# Patient Record
Sex: Male | Born: 1940
Health system: Southern US, Community
[De-identification: ages and names within clinical notes are randomized; demographics above are authoritative.]

## PROBLEM LIST (undated history)

## (undated) DIAGNOSIS — N179 Acute kidney failure, unspecified: Secondary | ICD-10-CM

## (undated) DIAGNOSIS — R079 Chest pain, unspecified: Secondary | ICD-10-CM

## (undated) DIAGNOSIS — Z85828 Personal history of other malignant neoplasm of skin: Secondary | ICD-10-CM

## (undated) DIAGNOSIS — M1990 Unspecified osteoarthritis, unspecified site: Secondary | ICD-10-CM

## (undated) DIAGNOSIS — Z8739 Personal history of other diseases of the musculoskeletal system and connective tissue: Secondary | ICD-10-CM

## (undated) DIAGNOSIS — I4892 Unspecified atrial flutter: Secondary | ICD-10-CM

## (undated) DIAGNOSIS — C61 Malignant neoplasm of prostate: Secondary | ICD-10-CM

## (undated) DIAGNOSIS — K219 Gastro-esophageal reflux disease without esophagitis: Secondary | ICD-10-CM

## (undated) DIAGNOSIS — R7303 Prediabetes: Secondary | ICD-10-CM

## (undated) DIAGNOSIS — Z8719 Personal history of other diseases of the digestive system: Secondary | ICD-10-CM

## (undated) HISTORY — DX: Acute kidney failure, unspecified: N17.9

## (undated) HISTORY — DX: Malignant neoplasm of prostate: C61

## (undated) HISTORY — DX: Unspecified atrial flutter: I48.92

## (undated) HISTORY — PX: WRIST SURGERY: SHX841

## (undated) HISTORY — PX: OTHER SURGICAL HISTORY: SHX169

## (undated) HISTORY — DX: Chest pain, unspecified: R07.9

## (undated) HISTORY — PX: CIRCUMCISION: SUR203

---

## 2008-11-11 ENCOUNTER — Encounter: Admission: RE | Admit: 2008-11-11 | Discharge: 2008-11-11 | Payer: Self-pay | Admitting: Internal Medicine

## 2009-11-21 ENCOUNTER — Ambulatory Visit (HOSPITAL_BASED_OUTPATIENT_CLINIC_OR_DEPARTMENT_OTHER): Admission: RE | Admit: 2009-11-21 | Discharge: 2009-11-21 | Payer: Self-pay | Admitting: Urology

## 2011-06-16 DIAGNOSIS — Z23 Encounter for immunization: Secondary | ICD-10-CM | POA: Diagnosis not present

## 2011-06-25 DIAGNOSIS — H6120 Impacted cerumen, unspecified ear: Secondary | ICD-10-CM | POA: Diagnosis not present

## 2011-06-25 DIAGNOSIS — H902 Conductive hearing loss, unspecified: Secondary | ICD-10-CM | POA: Diagnosis not present

## 2011-12-09 DIAGNOSIS — D235 Other benign neoplasm of skin of trunk: Secondary | ICD-10-CM | POA: Diagnosis not present

## 2011-12-09 DIAGNOSIS — L570 Actinic keratosis: Secondary | ICD-10-CM | POA: Diagnosis not present

## 2011-12-09 DIAGNOSIS — L820 Inflamed seborrheic keratosis: Secondary | ICD-10-CM | POA: Diagnosis not present

## 2012-02-05 DIAGNOSIS — J189 Pneumonia, unspecified organism: Secondary | ICD-10-CM | POA: Diagnosis not present

## 2012-04-07 DIAGNOSIS — M1710 Unilateral primary osteoarthritis, unspecified knee: Secondary | ICD-10-CM | POA: Diagnosis not present

## 2012-10-25 DIAGNOSIS — R21 Rash and other nonspecific skin eruption: Secondary | ICD-10-CM | POA: Diagnosis not present

## 2012-10-25 DIAGNOSIS — E119 Type 2 diabetes mellitus without complications: Secondary | ICD-10-CM | POA: Diagnosis not present

## 2012-11-12 DIAGNOSIS — J069 Acute upper respiratory infection, unspecified: Secondary | ICD-10-CM | POA: Diagnosis not present

## 2013-01-05 DIAGNOSIS — H902 Conductive hearing loss, unspecified: Secondary | ICD-10-CM | POA: Diagnosis not present

## 2013-01-05 DIAGNOSIS — H6120 Impacted cerumen, unspecified ear: Secondary | ICD-10-CM | POA: Diagnosis not present

## 2013-01-18 DIAGNOSIS — L570 Actinic keratosis: Secondary | ICD-10-CM | POA: Diagnosis not present

## 2013-01-18 DIAGNOSIS — D235 Other benign neoplasm of skin of trunk: Secondary | ICD-10-CM | POA: Diagnosis not present

## 2013-03-08 DIAGNOSIS — L570 Actinic keratosis: Secondary | ICD-10-CM | POA: Diagnosis not present

## 2013-04-13 DIAGNOSIS — Z1211 Encounter for screening for malignant neoplasm of colon: Secondary | ICD-10-CM | POA: Diagnosis not present

## 2013-04-13 DIAGNOSIS — Z23 Encounter for immunization: Secondary | ICD-10-CM | POA: Diagnosis not present

## 2013-04-13 DIAGNOSIS — E782 Mixed hyperlipidemia: Secondary | ICD-10-CM | POA: Diagnosis not present

## 2013-04-13 DIAGNOSIS — M069 Rheumatoid arthritis, unspecified: Secondary | ICD-10-CM | POA: Diagnosis not present

## 2013-04-13 DIAGNOSIS — Z125 Encounter for screening for malignant neoplasm of prostate: Secondary | ICD-10-CM | POA: Diagnosis not present

## 2013-04-13 DIAGNOSIS — E119 Type 2 diabetes mellitus without complications: Secondary | ICD-10-CM | POA: Diagnosis not present

## 2013-04-13 DIAGNOSIS — Z0000 Encounter for general adult medical examination without abnormal findings: Secondary | ICD-10-CM | POA: Diagnosis not present

## 2013-04-24 DIAGNOSIS — Z1211 Encounter for screening for malignant neoplasm of colon: Secondary | ICD-10-CM | POA: Diagnosis not present

## 2013-05-03 DIAGNOSIS — R972 Elevated prostate specific antigen [PSA]: Secondary | ICD-10-CM | POA: Diagnosis not present

## 2013-05-03 DIAGNOSIS — N400 Enlarged prostate without lower urinary tract symptoms: Secondary | ICD-10-CM | POA: Diagnosis not present

## 2013-05-11 DIAGNOSIS — R972 Elevated prostate specific antigen [PSA]: Secondary | ICD-10-CM | POA: Diagnosis not present

## 2013-05-11 DIAGNOSIS — C61 Malignant neoplasm of prostate: Secondary | ICD-10-CM | POA: Diagnosis not present

## 2013-05-18 DIAGNOSIS — C61 Malignant neoplasm of prostate: Secondary | ICD-10-CM | POA: Diagnosis not present

## 2013-05-31 ENCOUNTER — Other Ambulatory Visit: Payer: Self-pay | Admitting: Urology

## 2013-06-14 DIAGNOSIS — M6281 Muscle weakness (generalized): Secondary | ICD-10-CM | POA: Diagnosis not present

## 2013-06-14 DIAGNOSIS — R279 Unspecified lack of coordination: Secondary | ICD-10-CM | POA: Diagnosis not present

## 2013-06-14 DIAGNOSIS — C61 Malignant neoplasm of prostate: Secondary | ICD-10-CM | POA: Diagnosis not present

## 2013-06-22 DIAGNOSIS — C61 Malignant neoplasm of prostate: Secondary | ICD-10-CM | POA: Diagnosis not present

## 2013-06-22 DIAGNOSIS — R279 Unspecified lack of coordination: Secondary | ICD-10-CM | POA: Diagnosis not present

## 2013-06-22 DIAGNOSIS — M6281 Muscle weakness (generalized): Secondary | ICD-10-CM | POA: Diagnosis not present

## 2013-06-27 ENCOUNTER — Encounter (HOSPITAL_COMMUNITY): Payer: Self-pay | Admitting: Pharmacy Technician

## 2013-06-27 NOTE — Patient Instructions (Addendum)
Robert Summers  06/27/2013                           YOUR PROCEDURE IS SCHEDULED ON: 07/05/13               PLEASE REPORT TO SHORT STAY CENTER AT : 10:45 am               CALL THIS NUMBER IF ANY PROBLEMS THE DAY OF SURGERY :               [phone]                      REMEMBER:   Do not eat food or drink liquids AFTER MIDNIGHT  May have clear liquids UNTIL 6 HOURS BEFORE SURGERY (7:15 AM)  Clear liquids include soda, tea, black coffee, apple or grape juice, broth.  Take these medicines the morning of surgery with A SIP OF WATER:  NONE   Do not wear jewelry, make-up   Do not wear lotions, powders, or perfumes.   Do not shave legs or underarms 12 hrs. before surgery (men may shave face)  Do not bring valuables to the hospital.  Contacts, dentures or bridgework may not be worn into surgery.  Leave suitcase in the car. After surgery it may be brought to your room.  For patients admitted to the hospital more than one night, checkout time is 11:00                          The day of discharge.   Patients discharged the day of surgery will not be allowed to drive home                             If going home same day of surgery, must have someone stay with you first                           24 hrs at home and arrange for some one to drive you home from hospital.    Special Instructions:   Please read over the following fact sheets that you were given:                                    2. [place]                                                X_____________________________________________________________________        Failure to follow these instructions may result in cancellation of your surgery

## 2013-06-28 ENCOUNTER — Encounter (HOSPITAL_COMMUNITY): Payer: Self-pay

## 2013-06-28 ENCOUNTER — Encounter (HOSPITAL_COMMUNITY)
Admission: RE | Admit: 2013-06-28 | Discharge: 2013-06-28 | Disposition: A | Discharge status: Home / Self Care | Payer: Medicare Other | Source: Ambulatory Visit | Attending: Urology | Admitting: Urology

## 2013-06-28 DIAGNOSIS — Z01812 Encounter for preprocedural laboratory examination: Secondary | ICD-10-CM | POA: Diagnosis not present

## 2013-06-28 DIAGNOSIS — Z01810 Encounter for preprocedural cardiovascular examination: Secondary | ICD-10-CM | POA: Insufficient documentation

## 2013-06-28 DIAGNOSIS — Z01818 Encounter for other preprocedural examination: Secondary | ICD-10-CM | POA: Insufficient documentation

## 2013-06-28 HISTORY — DX: Prediabetes: R73.03

## 2013-06-28 HISTORY — DX: Personal history of other diseases of the musculoskeletal system and connective tissue: Z87.39

## 2013-06-28 HISTORY — DX: Unspecified osteoarthritis, unspecified site: M19.90

## 2013-06-28 HISTORY — DX: Personal history of other diseases of the digestive system: Z87.19

## 2013-06-28 HISTORY — DX: Personal history of other malignant neoplasm of skin: Z85.828

## 2013-06-28 HISTORY — DX: Malignant neoplasm of prostate: C61

## 2013-06-28 HISTORY — DX: Gastro-esophageal reflux disease without esophagitis: K21.9

## 2013-06-28 LAB — CBC
HCT: 49.8 % (ref 39.0–52.0)
Hemoglobin: 17.6 g/dL — ABNORMAL HIGH (ref 13.0–17.0)
MCH: 30.9 pg (ref 26.0–34.0)
MCHC: 35.3 g/dL (ref 30.0–36.0)
MCV: 87.4 fL (ref 78.0–100.0)
Platelets: 166 10*3/uL (ref 150–400)
RBC: 5.7 MIL/uL (ref 4.22–5.81)
RDW: 13.2 % (ref 11.5–15.5)
WBC: 5.5 10*3/uL (ref 4.0–10.5)

## 2013-06-28 LAB — BASIC METABOLIC PANEL
BUN: 17 mg/dL (ref 6–23)
CALCIUM: 9.6 mg/dL (ref 8.4–10.5)
CHLORIDE: 101 meq/L (ref 96–112)
CO2: 27 mEq/L (ref 19–32)
CREATININE: 1.26 mg/dL (ref 0.50–1.35)
GFR calc Af Amer: 64 mL/min — ABNORMAL LOW (ref >90)
GFR calc non Af Amer: 55 mL/min — ABNORMAL LOW (ref >90)
Glucose, Bld: 116 mg/dL — ABNORMAL HIGH (ref 70–99)
POTASSIUM: 4.9 meq/L (ref 3.7–5.3)
SODIUM: 139 meq/L (ref 137–147)

## 2013-06-28 LAB — ABO/RH: ABO/RH(D): O POS

## 2013-06-28 NOTE — Progress Notes (Signed)
EKG reviewed by Dr. Lissa Hoard - no action needed

## 2013-06-29 DIAGNOSIS — R279 Unspecified lack of coordination: Secondary | ICD-10-CM | POA: Diagnosis not present

## 2013-06-29 DIAGNOSIS — M6281 Muscle weakness (generalized): Secondary | ICD-10-CM | POA: Diagnosis not present

## 2013-06-29 DIAGNOSIS — C61 Malignant neoplasm of prostate: Secondary | ICD-10-CM | POA: Diagnosis not present

## 2013-07-01 DIAGNOSIS — J069 Acute upper respiratory infection, unspecified: Secondary | ICD-10-CM | POA: Diagnosis not present

## 2013-07-01 DIAGNOSIS — R509 Fever, unspecified: Secondary | ICD-10-CM | POA: Diagnosis not present

## 2013-07-04 NOTE — H&P (Signed)
ctive Problems Problems   1. Benign prostatic hypertrophy without lower urinary tract symptoms (600.00)  2. Elevated prostate specific antigen (PSA) (790.93)  3. Prostate cancer (185)  History of Present Illness   Robert Summers returns today in f/u to discuss his prostate biopsy.  He was found to have a T2a Nx Mx Gleason 6(3+3) cancer in 4 cores with the bulk of the cancer in the right base where the induration was noted.  His prostate volume was 38cc.  His [place] score is 3 which predicts a 41% probability of positive margins, a 26% probability of ECE, 9% SVI and 1.4% LNI with a predicted BRFS at 3 years of 73% and at 5 years of 60%.   Past Medical History Problems   1. History of Arthritis (V13.4)  2. History of Gout (274.00)  3. History of esophageal reflux (V12.79)  4. History of Hydrocele of testis (603.9)  5. History of Thrombophlebitis of leg, superficial (451.0)  Surgical History Problems   1. History of Elective Circumcision  2. History of Surgery Excision Of Spermatocele With Epididymectomy Right  3. History of Surgery Tunica Vaginalis Excision Of Hydrocele Right  4. History of Wrist Incision  Current Meds  1. Aspirin 81 MG Oral Tablet;  Therapy: (Recorded:16Jun2011) to Recorded  2. Biotin TABS;  Therapy: (Recorded:03Dec2014) to Recorded  3. Levofloxacin 500 MG Oral Tablet; Take one tablet daily starting day before procedure;  Therapy: [id number] to (Evaluate:06Dec2014)  Requested for: [id number]; Last  Rx:03Dec2014 Ordered  4. Magnesium CAPS;  Therapy: (Recorded:03Dec2014) to Recorded  5. Saw Palmetto Plus CAPS;  Therapy: (Recorded:03Dec2014) to Recorded  6. Super B-100 TABS;  Therapy: (Recorded:03Dec2014) to Recorded  7. Vitamin C TABS;  Therapy: (Recorded:03Dec2014) to Recorded  8. Vitamin D-3 TABS;  Therapy: (Recorded:03Dec2014) to Recorded  Allergies Medication   1. No Known Drug Allergies  Family History Problems   1. Family history of Death In The Family  Father : Father   deceased age 70--[place]  21. Family history of Death In The Family Mother : Father   deceased age 62--unknown  78. Family history of Diabetes Mellitus (V18.0) : Mother  4. Family history of Family Health Status Number Of Children : Father   2 daughters  37. Family history of malignant neoplasm of prostate (V25.36) : Brother  6. Family history of Heart Disease (V17.49) : Father  Social History Problems   1. Denied: History of Alcohol Use  2. Caffeine Use   1 per day  3. Former smoker (V15.82)  4. Marital History - Currently Married  5. Married  6. Retired   from [employer] and then [employer].  7. Retired From Work  18. Social alcohol use  9. Tobacco Use (V15.82)   smoked for 15yrs quit 48yrs ago  Results/Data  The following clinical lab reports were reviewed:  Path report reviewed.  The following medical tests were reviewed: CAPRA score calculated and reviewed.  IPSS: The IPSS today is 0   SHIM: The SHIM score today is 25 .    Assessment Assessed   1. Prostate cancer (185)   He has T2a Nx Mx Gleason 6 prostate cancer with a PSA that has been rising.   He has 2 adjacent cores in the lateral prostate at the right mid and base gland with 40% and 80% involvement.   His prostate is small and he has normal erectile function and no voiding complaints.   Plan Prostate cancer   1. Follow-up Schedule Surgery Office  Follow-up  Status: Hold For - Appointment   Requested for: [phone]   I have reviewed the treatment options and risks as outlined in my prostate cancer handout that he was given for review.  I will not recapitulate the details.  He is a potential candidate for AS with his low risk disease and age but I am concerned about the PSA rise, his lack of comorbidities and the more bulky disease at the right base.  If he were to choose that option, I would want to consider possibly a Prolaris test or an early repeat biopsy, but at this time he is not interested in  AS.  He is a candidate for radical prostatectomy and I discussed the 3 approaches and the risks associated with each.  I am concerned that at his age he is at greater risk for postoperative complications as well as a greater risk of persistent incontinence and poor recovery of erectile dysfunction despite his preoperative status.  He and his wife are most interested in this approach and while they have considered perineal prostatectomy as a favorable option were in favor of a robotic approach after our discussion and would like to proceed with a RALP.  I discussed both EXRT and seeds in detail and he would be an excellent candidate for either and in particular seeds with his small gland, low risk disease, minimal LUTS and good erectile function, but they have concerns about radiation and would like to have the prostate out.  I also mentioned cryotherapy and HIFU but didn't recommend either for him.    I will get him posted for RALP and will set up a PT consultation.

## 2013-07-05 ENCOUNTER — Encounter (HOSPITAL_COMMUNITY)
Admission: RE | Disposition: A | Discharge status: Home / Self Care | Payer: Self-pay | Source: Ambulatory Visit | Attending: Urology

## 2013-07-05 ENCOUNTER — Encounter (HOSPITAL_COMMUNITY): Payer: Self-pay | Admitting: *Deleted

## 2013-07-05 ENCOUNTER — Inpatient Hospital Stay (HOSPITAL_COMMUNITY)
Admission: RE | Admit: 2013-07-05 | Discharge: 2013-07-06 | DRG: 708 | Disposition: A | Discharge status: Home / Self Care | Payer: Medicare Other | Source: Ambulatory Visit | Attending: Urology | Admitting: Urology

## 2013-07-05 ENCOUNTER — Inpatient Hospital Stay (HOSPITAL_COMMUNITY): Payer: Medicare Other | Admitting: Certified Registered Nurse Anesthetist

## 2013-07-05 ENCOUNTER — Encounter (HOSPITAL_COMMUNITY): Payer: Medicare Other | Admitting: Certified Registered Nurse Anesthetist

## 2013-07-05 DIAGNOSIS — C61 Malignant neoplasm of prostate: Principal | ICD-10-CM | POA: Diagnosis present

## 2013-07-05 DIAGNOSIS — Z8042 Family history of malignant neoplasm of prostate: Secondary | ICD-10-CM | POA: Diagnosis not present

## 2013-07-05 DIAGNOSIS — Z87891 Personal history of nicotine dependence: Secondary | ICD-10-CM

## 2013-07-05 DIAGNOSIS — K219 Gastro-esophageal reflux disease without esophagitis: Secondary | ICD-10-CM | POA: Diagnosis not present

## 2013-07-05 DIAGNOSIS — Z7982 Long term (current) use of aspirin: Secondary | ICD-10-CM

## 2013-07-05 DIAGNOSIS — N400 Enlarged prostate without lower urinary tract symptoms: Secondary | ICD-10-CM | POA: Diagnosis present

## 2013-07-05 DIAGNOSIS — M109 Gout, unspecified: Secondary | ICD-10-CM | POA: Diagnosis not present

## 2013-07-05 DIAGNOSIS — Z833 Family history of diabetes mellitus: Secondary | ICD-10-CM | POA: Diagnosis not present

## 2013-07-05 DIAGNOSIS — Z8672 Personal history of thrombophlebitis: Secondary | ICD-10-CM

## 2013-07-05 DIAGNOSIS — Z79899 Other long term (current) drug therapy: Secondary | ICD-10-CM

## 2013-07-05 DIAGNOSIS — Z8249 Family history of ischemic heart disease and other diseases of the circulatory system: Secondary | ICD-10-CM | POA: Diagnosis not present

## 2013-07-05 HISTORY — DX: Malignant neoplasm of prostate: C61

## 2013-07-05 HISTORY — PX: ROBOT ASSISTED LAPAROSCOPIC RADICAL PROSTATECTOMY: SHX5141

## 2013-07-05 LAB — TYPE AND SCREEN
ABO/RH(D): O POS
Antibody Screen: NEGATIVE

## 2013-07-05 LAB — HEMOGLOBIN AND HEMATOCRIT, BLOOD
HEMATOCRIT: 43.7 % (ref 39.0–52.0)
Hemoglobin: 15.2 g/dL (ref 13.0–17.0)

## 2013-07-05 SURGERY — ROBOTIC ASSISTED LAPAROSCOPIC RADICAL PROSTATECTOMY
Anesthesia: General

## 2013-07-05 MED ORDER — HEPARIN SODIUM (PORCINE) 1000 UNIT/ML IJ SOLN
INTRAMUSCULAR | Status: AC
  Filled 2013-07-05: qty 1

## 2013-07-05 MED ORDER — DEXAMETHASONE SODIUM PHOSPHATE 10 MG/ML IJ SOLN
INTRAMUSCULAR | Status: DC | PRN
  Administered 2013-07-05: 5 mg via INTRAVENOUS

## 2013-07-05 MED ORDER — ROCURONIUM BROMIDE 100 MG/10ML IV SOLN
INTRAVENOUS | Status: AC
  Filled 2013-07-05: qty 1

## 2013-07-05 MED ORDER — ROCURONIUM BROMIDE 100 MG/10ML IV SOLN
INTRAVENOUS | Status: DC | PRN
  Administered 2013-07-05: 50 mg via INTRAVENOUS
  Administered 2013-07-05 (×4): 10 mg via INTRAVENOUS

## 2013-07-05 MED ORDER — SODIUM CHLORIDE 0.9 % IV BOLUS (SEPSIS)
1000.0000 mL | Freq: Once | INTRAVENOUS | Status: AC
  Administered 2013-07-05: 1000 mL via INTRAVENOUS

## 2013-07-05 MED ORDER — FENTANYL CITRATE 0.05 MG/ML IJ SOLN
INTRAMUSCULAR | Status: DC | PRN
  Administered 2013-07-05: 100 ug via INTRAVENOUS
  Administered 2013-07-05 (×3): 50 ug via INTRAVENOUS

## 2013-07-05 MED ORDER — GLYCOPYRROLATE 0.2 MG/ML IJ SOLN
INTRAMUSCULAR | Status: AC
  Filled 2013-07-05: qty 2

## 2013-07-05 MED ORDER — CIPROFLOXACIN HCL 500 MG PO TABS: 500.0000 mg | ORAL_TABLET | Freq: Two times a day (BID) | ORAL | Status: DC

## 2013-07-05 MED ORDER — MEPERIDINE HCL 50 MG/ML IJ SOLN: 6.2500 mg | INTRAMUSCULAR | Status: DC | PRN

## 2013-07-05 MED ORDER — ONDANSETRON HCL 4 MG/2ML IJ SOLN
INTRAMUSCULAR | Status: AC
  Filled 2013-07-05: qty 2

## 2013-07-05 MED ORDER — HYDROCODONE-ACETAMINOPHEN 5-325 MG PO TABS: 1.0000 | ORAL_TABLET | ORAL | Status: DC | PRN

## 2013-07-05 MED ORDER — LIDOCAINE HCL (CARDIAC) 20 MG/ML IV SOLN
INTRAVENOUS | Status: DC | PRN
  Administered 2013-07-05: 80 mg via INTRAVENOUS

## 2013-07-05 MED ORDER — CEFAZOLIN SODIUM-DEXTROSE 2-3 GM-% IV SOLR
2.0000 g | INTRAVENOUS | Status: AC
  Administered 2013-07-05: 2 g via INTRAVENOUS

## 2013-07-05 MED ORDER — EPHEDRINE SULFATE 50 MG/ML IJ SOLN
INTRAMUSCULAR | Status: AC
Start: 2013-07-05 — End: 2013-07-05
  Filled 2013-07-05: qty 1

## 2013-07-05 MED ORDER — NEOSTIGMINE METHYLSULFATE 1 MG/ML IJ SOLN
INTRAMUSCULAR | Status: AC
  Filled 2013-07-05: qty 10

## 2013-07-05 MED ORDER — LACTATED RINGERS IV SOLN
INTRAVENOUS | Status: DC | PRN
  Administered 2013-07-05: 14:00:00

## 2013-07-05 MED ORDER — SUCCINYLCHOLINE CHLORIDE 20 MG/ML IJ SOLN
INTRAMUSCULAR | Status: DC | PRN
  Administered 2013-07-05: 140 mg via INTRAVENOUS

## 2013-07-05 MED ORDER — GLYCOPYRROLATE 0.2 MG/ML IJ SOLN
INTRAMUSCULAR | Status: AC
  Filled 2013-07-05: qty 3

## 2013-07-05 MED ORDER — OXYCODONE HCL 5 MG/5ML PO SOLN: 5.0000 mg | Freq: Once | ORAL | Status: DC | PRN

## 2013-07-05 MED ORDER — GLYCOPYRROLATE 0.2 MG/ML IJ SOLN
INTRAMUSCULAR | Status: DC | PRN
  Administered 2013-07-05: .8 mg via INTRAVENOUS

## 2013-07-05 MED ORDER — SODIUM CHLORIDE 0.9 % IJ SOLN
INTRAMUSCULAR | Status: AC
  Filled 2013-07-05: qty 10

## 2013-07-05 MED ORDER — FENTANYL CITRATE 0.05 MG/ML IJ SOLN
INTRAMUSCULAR | Status: AC
  Filled 2013-07-05: qty 5

## 2013-07-05 MED ORDER — PROMETHAZINE HCL 25 MG/ML IJ SOLN: 6.2500 mg | INTRAMUSCULAR | Status: DC | PRN

## 2013-07-05 MED ORDER — BUPIVACAINE-EPINEPHRINE PF 0.25-1:200000 % IJ SOLN
INTRAMUSCULAR | Status: AC
  Filled 2013-07-05: qty 30

## 2013-07-05 MED ORDER — STERILE WATER FOR IRRIGATION IR SOLN
Status: DC | PRN
  Administered 2013-07-05: 1500 mL

## 2013-07-05 MED ORDER — OXYCODONE HCL 5 MG PO TABS: 5.0000 mg | ORAL_TABLET | Freq: Once | ORAL | Status: DC | PRN

## 2013-07-05 MED ORDER — BUPIVACAINE-EPINEPHRINE 0.25% -1:200000 IJ SOLN
INTRAMUSCULAR | Status: DC | PRN
  Administered 2013-07-05 (×2): 10 mL

## 2013-07-05 MED ORDER — LACTATED RINGERS IV SOLN
INTRAVENOUS | Status: DC | PRN
  Administered 2013-07-05 (×2): via INTRAVENOUS

## 2013-07-05 MED ORDER — ACETAMINOPHEN 325 MG PO TABS: 650.0000 mg | ORAL_TABLET | ORAL | Status: DC | PRN

## 2013-07-05 MED ORDER — PHENYLEPHRINE HCL 10 MG/ML IJ SOLN
10.0000 mg | INTRAVENOUS | Status: DC | PRN
  Administered 2013-07-05: 40 ug/min via INTRAVENOUS

## 2013-07-05 MED ORDER — CEFAZOLIN SODIUM-DEXTROSE 2-3 GM-% IV SOLR
INTRAVENOUS | Status: AC
  Filled 2013-07-05: qty 50

## 2013-07-05 MED ORDER — NEOSTIGMINE METHYLSULFATE 1 MG/ML IJ SOLN
INTRAMUSCULAR | Status: DC | PRN
  Administered 2013-07-05: 5 mg via INTRAVENOUS

## 2013-07-05 MED ORDER — PHENYLEPHRINE HCL 10 MG/ML IJ SOLN
INTRAMUSCULAR | Status: AC
  Filled 2013-07-05: qty 1

## 2013-07-05 MED ORDER — HYDROMORPHONE HCL PF 1 MG/ML IJ SOLN
INTRAMUSCULAR | Status: DC | PRN
  Administered 2013-07-05: 0.5 mg via INTRAVENOUS
  Administered 2013-07-05: 1 mg via INTRAVENOUS

## 2013-07-05 MED ORDER — HYDROMORPHONE HCL PF 1 MG/ML IJ SOLN: 0.2500 mg | INTRAMUSCULAR | Status: DC | PRN

## 2013-07-05 MED ORDER — LIDOCAINE HCL (CARDIAC) 20 MG/ML IV SOLN
INTRAVENOUS | Status: AC
  Filled 2013-07-05: qty 5

## 2013-07-05 MED ORDER — SODIUM CHLORIDE 0.9 % IR SOLN
Status: DC | PRN
  Administered 2013-07-05: 300 mL via INTRAVESICAL

## 2013-07-05 MED ORDER — HYDROMORPHONE HCL PF 1 MG/ML IJ SOLN: 0.5000 mg | INTRAMUSCULAR | Status: DC | PRN

## 2013-07-05 MED ORDER — PROPOFOL 10 MG/ML IV BOLUS
INTRAVENOUS | Status: AC
  Filled 2013-07-05: qty 20

## 2013-07-05 MED ORDER — HYDROCODONE-ACETAMINOPHEN 5-325 MG PO TABS: 1.0000 | ORAL_TABLET | Freq: Four times a day (QID) | ORAL | Status: DC | PRN

## 2013-07-05 MED ORDER — MIDAZOLAM HCL 5 MG/5ML IJ SOLN
INTRAMUSCULAR | Status: DC | PRN
  Administered 2013-07-05 (×2): 1 mg via INTRAVENOUS

## 2013-07-05 MED ORDER — MIDAZOLAM HCL 2 MG/2ML IJ SOLN
INTRAMUSCULAR | Status: AC
  Filled 2013-07-05: qty 2

## 2013-07-05 MED ORDER — DEXTROSE-NACL 5-0.45 % IV SOLN
INTRAVENOUS | Status: DC
  Administered 2013-07-05 – 2013-07-06 (×2): via INTRAVENOUS

## 2013-07-05 MED ORDER — EPHEDRINE SULFATE 50 MG/ML IJ SOLN
INTRAMUSCULAR | Status: DC | PRN
  Administered 2013-07-05 (×2): 5 mg via INTRAVENOUS

## 2013-07-05 MED ORDER — PROPOFOL 10 MG/ML IV BOLUS
INTRAVENOUS | Status: DC | PRN
  Administered 2013-07-05: 170 mg via INTRAVENOUS

## 2013-07-05 MED ORDER — HYDROMORPHONE HCL PF 2 MG/ML IJ SOLN
INTRAMUSCULAR | Status: AC
  Filled 2013-07-05: qty 1

## 2013-07-05 MED ORDER — ONDANSETRON HCL 4 MG/2ML IJ SOLN
INTRAMUSCULAR | Status: DC | PRN
  Administered 2013-07-05: 4 mg via INTRAVENOUS

## 2013-07-05 SURGICAL SUPPLY — 47 items
CANISTER SUCTION 2500CC (MISCELLANEOUS) IMPLANT
CATH FOLEY 2WAY SLVR 18FR 30CC (CATHETERS) ×3 IMPLANT
CATH ROBINSON RED A/P 16FR (CATHETERS) ×3 IMPLANT
CATH ROBINSON RED A/P 8FR (CATHETERS) ×3 IMPLANT
CATH TIEMANN FOLEY 18FR 5CC (CATHETERS) ×3 IMPLANT
CAUTERY SURG HI TEMP FINE TP (MISCELLANEOUS) ×3 IMPLANT
CHLORAPREP W/TINT 26ML (MISCELLANEOUS) ×3 IMPLANT
CLOTH BEACON ORANGE TIMEOUT ST (SAFETY) ×3 IMPLANT
COVER SURGICAL LIGHT HANDLE (MISCELLANEOUS) ×3 IMPLANT
COVER TIP SHEARS 8 DVNC (MISCELLANEOUS) ×1 IMPLANT
COVER TIP SHEARS 8MM DA VINCI (MISCELLANEOUS) ×2
CUTTER ECHEON FLEX ENDO 45 340 (ENDOMECHANICALS) ×3 IMPLANT
DECANTER SPIKE VIAL GLASS SM (MISCELLANEOUS) IMPLANT
DRAPE SURG IRRIG POUCH 19X23 (DRAPES) ×3 IMPLANT
DRSG TEGADERM 2-3/8X2-3/4 SM (GAUZE/BANDAGES/DRESSINGS) ×15 IMPLANT
DRSG TEGADERM 4X4.75 (GAUZE/BANDAGES/DRESSINGS) ×6 IMPLANT
DRSG TEGADERM 6X8 (GAUZE/BANDAGES/DRESSINGS) ×6 IMPLANT
ELECT REM PT RETURN 9FT ADLT (ELECTROSURGICAL) ×3
ELECTRODE REM PT RTRN 9FT ADLT (ELECTROSURGICAL) ×1 IMPLANT
GAUZE SPONGE 2X2 8PLY STRL LF (GAUZE/BANDAGES/DRESSINGS) ×1 IMPLANT
GLOVE BIO SURGEON STRL SZ 6.5 (GLOVE) ×2 IMPLANT
GLOVE BIO SURGEONS STRL SZ 6.5 (GLOVE) ×1
GLOVE SURG SS PI 8.0 STRL IVOR (GLOVE) ×9 IMPLANT
GOWN STRL REUS W/TWL LRG LVL3 (GOWN DISPOSABLE) ×12 IMPLANT
GOWN STRL REUS W/TWL XL LVL3 (GOWN DISPOSABLE) ×6 IMPLANT
HEMOSTAT SURGICEL 4X8 (HEMOSTASIS) ×3 IMPLANT
HOLDER FOLEY CATH W/STRAP (MISCELLANEOUS) ×3 IMPLANT
IV LACTATED RINGERS 1000ML (IV SOLUTION) ×3 IMPLANT
KIT ACCESSORY DA VINCI DISP (KITS) ×2
KIT ACCESSORY DVNC DISP (KITS) ×1 IMPLANT
NDL SAFETY ECLIPSE 18X1.5 (NEEDLE) ×1 IMPLANT
NEEDLE HYPO 18GX1.5 SHARP (NEEDLE) ×2
PACK ROBOT UROLOGY CUSTOM (CUSTOM PROCEDURE TRAY) ×3 IMPLANT
RELOAD GREEN ECHELON 45 (STAPLE) ×3 IMPLANT
SEALER TISSUE G2 CVD JAW 45CM (ENDOMECHANICALS) ×3 IMPLANT
SET TUBE IRRIG SUCTION NO TIP (IRRIGATION / IRRIGATOR) ×3 IMPLANT
SOLUTION ELECTROLUBE (MISCELLANEOUS) ×3 IMPLANT
SPONGE GAUZE 2X2 STER 10/PKG (GAUZE/BANDAGES/DRESSINGS) ×2
SUT ETHILON 3 0 PS 1 (SUTURE) ×3 IMPLANT
SUT MNCRL 3 0 RB1 (SUTURE) ×1 IMPLANT
SUT MONOCRYL 3 0 RB1 (SUTURE) ×2
SUT VIC AB 2-0 SH 27 (SUTURE) ×2
SUT VIC AB 2-0 SH 27X BRD (SUTURE) ×1 IMPLANT
SUT VICRYL 0 UR6 27IN ABS (SUTURE) ×3 IMPLANT
SYR 27GX1/2 1ML LL SAFETY (SYRINGE) ×3 IMPLANT
TOWEL OR NON WOVEN STRL DISP B (DISPOSABLE) ×3 IMPLANT
WATER STERILE IRR 1500ML POUR (IV SOLUTION) ×6 IMPLANT

## 2013-07-05 NOTE — Preoperative (Signed)
Beta Blockers   Reason not to administer Beta Blockers:Not Applicable 

## 2013-07-05 NOTE — Transfer of Care (Signed)
Immediate Anesthesia Transfer of Care Note  Patient: Robert Summers  Procedure(s) Performed: Procedure(s) (LRB): ROBOTIC ASSISTED LAPAROSCOPIC NERVE SPARING RADICAL PROSTATECTOMY (N/A)  Patient Location: PACU  Anesthesia Type: General  Level of Consciousness: sedated, patient cooperative and responds to stimulation  Airway & Oxygen Therapy: Patient Spontanous Breathing and Patient connected to face mask oxgen  Post-op Assessment: Report given to PACU RN and Post -op Vital signs reviewed and stable  Post vital signs: Reviewed and stable  Complications: No apparent anesthesia complications

## 2013-07-05 NOTE — Anesthesia Postprocedure Evaluation (Signed)
Anesthesia Post Note  Patient: Robert Summers  Procedure(s) Performed: Procedure(s) (LRB): ROBOTIC ASSISTED LAPAROSCOPIC NERVE SPARING RADICAL PROSTATECTOMY (N/A)  Anesthesia type: General  Patient location: PACU  Post pain: Pain level controlled  Post assessment: Post-op Vital signs reviewed  Last Vitals: BP 141/64  Pulse 73  Temp(Src) 36.5 C (Oral)  Resp 20  Ht 5\' 10"  (1.778 m)  Wt 242 lb (109.77 kg)  BMI 34.72 kg/m2  SpO2 100%  Post vital signs: Reviewed  Level of consciousness: sedated  Complications: No apparent anesthesia complications

## 2013-07-05 NOTE — Interval H&P Note (Signed)
History and Physical Interval Note:  He has no new complaints.    Lungs clear.  CV RRR.   Labs reviewed.    07/05/2013 12:27 PM  Robert Summers  has presented today for surgery, with the diagnosis of PROSTATE CANCER  The various methods of treatment have been discussed with the patient and family. After consideration of risks, benefits and other options for treatment, the patient has consented to  Procedure(s): ROBOTIC [place] (N/A) as a surgical intervention .  The patient's history has been reviewed, patient examined, no change in status, stable for surgery.  I have reviewed the patient's chart and labs.  Questions were answered to the patient's satisfaction.     Hadassah Rana J

## 2013-07-05 NOTE — Anesthesia Preprocedure Evaluation (Signed)
Anesthesia Evaluation  Patient identified by MRN, date of birth, ID band Patient awake    Reviewed: Allergy & Precautions, H&P , NPO status , Patient's Chart, lab work & pertinent test results  Airway Mallampati: II TM Distance: >3 FB Neck ROM: Full    Dental  (+) Dental Advisory Given   Pulmonary former smoker,  breath sounds clear to auscultation- rhonchi        Cardiovascular negative cardio ROS  Rhythm:Regular Rate:Normal     Neuro/Psych negative neurological ROS  negative psych ROS   GI/Hepatic Neg liver ROS, hiatal hernia, GERD-  ,  Endo/Other  negative endocrine ROS  Renal/GU negative Renal ROS     Musculoskeletal negative musculoskeletal ROS (+)   Abdominal (+) + obese,   Peds  Hematology negative hematology ROS (+)   Anesthesia Other Findings   Reproductive/Obstetrics                           Anesthesia Physical Anesthesia Plan  ASA: II  Anesthesia Plan: General   Post-op Pain Management:    Induction: Intravenous  Airway Management Planned: Oral ETT  Additional Equipment:   Intra-op Plan:   Post-operative Plan: Extubation in OR  Informed Consent: I have reviewed the patients History and Physical, chart, labs and discussed the procedure including the risks, benefits and alternatives for the proposed anesthesia with the patient or authorized representative who has indicated his/her understanding and acceptance.   Dental advisory given  Plan Discussed with: CRNA  Anesthesia Plan Comments:         Anesthesia Quick Evaluation

## 2013-07-05 NOTE — Discharge Instructions (Signed)
1. Activity:  You are encouraged to ambulate frequently (about every hour during waking hours) to help prevent blood clots from forming in your legs or lungs.  However, you should not engage in any heavy lifting (> 10-15 lbs), strenuous activity, or straining. °2. Diet: You should continue a clear liquid diet until passing gas from below.  Once this occurs, you may advance your diet to a soft diet that would be easy to digest (i.e soups, scrambled eggs, mashed potatoes, etc.) for 24 hours just as you would if getting over a bad stomach flu.  If tolerating this diet well for 24 hours, you may then begin eating regular food.  It will be normal to have some amount of bloating, nausea, and abdominal discomfort intermittently. °3. Prescriptions:  You will be provided a prescription for pain medication to take as needed.  If your pain is not severe enough to require the prescription pain medication, you may take extra strength Tylenol instead.  You should also take an over the counter stool softener (Colace 100 mg twice daily) to avoid straining with bowel movements as the pain medication may constipate you. Finally, you will also be provided a prescription for an antibiotic to begin the day prior to your return visit in the office for catheter removal. °4. Catheter care: You will be taught how to take care of the catheter by the nursing staff prior to discharge from the hospital.  You may use both a leg bag and the larger bedside bag but it is recommended to at least use the bigger bedside bag at nighttime as the leg bag is small and will fill up overnight and also does not drain as well when lying flat. You may periodically feel a strong urge to void with the catheter in place.  This is a bladder spasm and most often can occur when having a bowel movement or when you are moving around. It is typically self-limited and usually will stop after a few minutes.  You may use some Vaseline or Neosporin around the tip of the  catheter to reduce friction at the tip of the penis. °5. Incisions: You may remove your dressing bandages the 2nd day after surgery.  You most likely will have a few small staples in each of the incisions and once the bandages are removed, the incisions may stay open to air.  You may start showering (not soaking or bathing in water) 48 hours after surgery and the incisions simply need to be patted dry after the shower.  No additional care is needed. °6. What to call us about: You should call the office ([phone]) if you develop fever > 101, persistent vomiting, or the catheter stops draining. Also, feel free to call with any other questions you may have and remember the handout that was provided to you as a reference preoperatively which answers many of the common questions that arise after surgery. ° °You may resume aspirin, vitamins, and supplements 7 days after surgery. °

## 2013-07-05 NOTE — Op Note (Signed)
Preoperative diagnosis: Clinically localized adenocarcinoma of the prostate (clinical stage T2a)  Postoperative diagnosis: Clinically localized adenocarcinoma of the prostate (clinical stage T2a)  Procedure:  1. Robotic assisted laparoscopic radical prostatectomy with bilateral nerve sparing   Surgeon: Irine Seal M.D.  Assistant: Felipa Furnace PA  Anesthesia: General  Complications: None  EBL: 200 mL   Specimens: 1. Prostate and seminal vesicles  Disposition of specimens: Pathology  Drains: 1. 20 Fr coude catheter 2. # 19 Blake pelvic drain  Indication: Robert Summers is a 73 y.o. year old patient with a T2a Nx Mx Gleason 6 clinically localized prostate cancer with 4 cores at the right base and mid prostate positive.  After a thorough review of the management options for treatment of prostate cancer, he elected to proceed with surgical therapy and the above procedure(s).  We have discussed the potential benefits and risks of the procedure, side effects of the proposed treatment, the likelihood of the patient achieving the goals of the procedure, and any potential problems that might occur during the procedure or recuperation. Informed consent has been obtained.  Description of procedure:  The patient was taken to the operating room and a general anesthetic was administered. He was given preoperative antibiotics, placed in the dorsal lithotomy position.  PAS hose were placed and a red rubber rectal catheter was placed, secured with tape and attached to an asepto syringe.  He was then prepped with chloroprep on the abdomen and betadine on the genitalia and then draped in the usual sterile fashion . Next a preoperative timeout was performed.   A urethral catheter was placed into the bladder and a site was selected near the umbilicus for placement of the camera port. This was placed using a standard open Hassan technique which allowed entry into the peritoneal cavity under direct  vision and without difficulty. A 12 mm port was placed and a pneumoperitoneum established. The camera was then used to inspect the abdomen and there was no evidence of any intra-abdominal injuries or other abnormalities. The remaining abdominal ports were then placed. 8 mm robotic ports were placed in the right lower quadrant, left lower quadrant, and far left lateral abdominal wall. A 5 mm port was placed in the right upper quadrant and a 12 mm port was placed in the right lateral abdominal wall for laparoscopic assistance. All ports were placed under direct vision without difficulty. The surgical cart was then docked.   Utilizing the cautery scissors, the bladder was reflected posteriorly allowing entry into the space of Retzius and identification of the endopelvic fascia and prostate. The periprostatic fat was then removed from the prostate allowing full exposure of the endopelvic fascia. The endopelvic fascia was then incised from the apex back to the base of the prostate bilaterally and the underlying levator muscle fibers were swept laterally off the prostate thereby isolating the dorsal venous complex. The dorsal vein was then stapled and divided with a 45 mm Flex Echelon stapler. Attention then turned to the bladder neck which was divided anteriorly thereby allowing entry into the bladder and exposure of the urethral catheter. The catheter balloon was deflated and the catheter was brought into the operative field and used to retract the prostate anteriorly. The posterior bladder neck was then examined and was divided allowing further dissection between the bladder and prostate posteriorly until the vasa deferentia and seminal vessels were identified. The vasa deferentia were isolated, divided, and lifted anteriorly. The seminal vesicles were dissected down to their tips with  care to control the seminal vascular arterial blood supply. The seminal vesicles and ampula were somewhat adherent to the surrounding  tissue. These structures were then lifted anteriorly and the space between Denonvillier's fascia and the anterior rectum was developed with a combination of sharp and blunt dissection. This isolated the vascular pedicles of the prostate.  The lateral prostatic fascia was then sharply incised allowing release of the neurovascular bundles bilaterally. The vascular pedicles of the prostate were then ligated with the Enseal and hemalock clips between the prostate and neurovascular bundles and divided with sharp cold scissor dissection where the clips were used resulting in bilateral neurovascular bundle preservation. The neurovascular bundles were then separated off the apex of the prostate and urethra bilaterally.  The urethra was then sharply transected allowing the prostate specimen to be disarticulated. The pelvis was copiously irrigated and an arterial bleeder was identified along the right bundle.  This was oversewn superficial with a 2-0 vicry and hemostasis was ensured. The pelvis was irrigated and insufflated and therre was no evidence for rectal injury.  Attention then turned to the urethral anastomosis. A 2-0 Vicryl slip knot was placed between Denonvillier's fascia, the posterior bladder neck, and the posterior urethra to reapproximate these structures. A double-armed 3-0 Monocryl suture was then used to perform a 360 running tension-free anastomosis between the bladder neck and urethra.   A new urethral catheter was then placed into the bladder and irrigated.  There was minor leakage so an additional interrupted urethrovesical stitch was placed posterolaterally on both sides and repeat irrigation was performed with no blood clots within the bladder and the anastomosis appeared to be watertight. A #19 Blake drain was then brought through the left lateral 8 mm port site and positioned appropriately within the pelvis. It was secured to the skin with a nylon suture. The surgical cart was then undocked.  The right lateral 12 mm port site was closed at the fascial level with a 0 Vicryl suture placed laparoscopically. All remaining ports were then removed under direct vision.  The prostate specimen was removed intact within the Endopouch retrieval bag via the periumbilical camera port site. This fascial opening was closed with running 0 Vicryl sutures. 0.25% Marcaine was then injected into all port sites and all incisions were reapproximated at the skin level with clips and Dermabond. The patient appeared to tolerate the procedure well and without complications. The patient was able to be extubated and transferred to the recovery unit in satisfactory condition.

## 2013-07-06 ENCOUNTER — Encounter (HOSPITAL_COMMUNITY): Payer: Self-pay | Admitting: Urology

## 2013-07-06 LAB — BASIC METABOLIC PANEL
BUN: 13 mg/dL (ref 6–23)
CALCIUM: 8.2 mg/dL — AB (ref 8.4–10.5)
CHLORIDE: 100 meq/L (ref 96–112)
CO2: 23 mEq/L (ref 19–32)
CREATININE: 1.21 mg/dL (ref 0.50–1.35)
GFR calc Af Amer: 67 mL/min — ABNORMAL LOW (ref >90)
GFR calc non Af Amer: 58 mL/min — ABNORMAL LOW (ref >90)
GLUCOSE: 227 mg/dL — AB (ref 70–99)
Potassium: 4.6 mEq/L (ref 3.7–5.3)
SODIUM: 133 meq/L — AB (ref 137–147)

## 2013-07-06 LAB — HEMOGLOBIN AND HEMATOCRIT, BLOOD
HEMATOCRIT: 43.2 % (ref 39.0–52.0)
Hemoglobin: 15.2 g/dL (ref 13.0–17.0)

## 2013-07-06 MED ORDER — BISACODYL 10 MG RE SUPP
10.0000 mg | Freq: Once | RECTAL | Status: AC
  Administered 2013-07-06: 10 mg via RECTAL
  Filled 2013-07-06: qty 1

## 2013-07-06 NOTE — Progress Notes (Signed)
Foley care and leg bag teaching done w/ pt and wife this AM. Wife demonstrates competency and both verbalize understanding and comfort with teaching. D/C instructions reviewed w/ pt and wife. Both verbalize understanding and all questions answered. Pt d/c in w/c in stable condition to family's car by NT. Pt in possession of d/c instructions, scripts, foley and leg bags, dsg supplies, and all personal belongings.

## 2013-07-06 NOTE — Progress Notes (Signed)
Patient ID: Robert Summers, male   DOB: 12-Mar-1941, 73 y.o.   MRN: [id number] 1 Day Post-Op Subjective: The patient is doing well.  No nausea or vomiting. Pain is adequately controlled. No flatus.  Has amb and using IS.  JP drainage overnight not recorded in Epic but was verified with RN.  7cc out overnight.   Objective: Vital signs in last 24 hours: Temp:  [97.2 F (36.2 C)-98.4 F (36.9 C)] 98.4 F (36.9 C) (02/05 0420) Pulse Rate:  [55-93] 83 (02/05 0420) Resp:  [15-20] 18 (02/05 0420) BP: (122-152)/(64-83) 129/66 mmHg (02/05 0420) SpO2:  [98 %-100 %] 98 % (02/05 0420) Weight:  [109.77 kg (242 lb)-110.5 kg (243 lb 9.7 oz)] 110.5 kg (243 lb 9.7 oz) (02/04 1750)  Intake/Output from previous day: 02/04 0701 - 02/05 0700 In: 3100 [I.V.:3100] Out: 2305 [Urine:2100; Drains:5; Blood:200] Intake/Output this shift:    Physical Exam:  General: Alert and oriented. CV: RRR Lungs: Clear bilaterally. GI: Soft, Nondistended. Incisions: Dressings intact. Urine: dark pink Extremities: Nontender, no erythema, no edema.  Lab Results:  Recent Labs  07/05/13 1655 07/06/13 0338  HGB 15.2 15.2  HCT 43.7 43.2      Assessment/Plan: POD# 1 s/p robotic prostatectomy.  1) SL IVF 2) Ambulate, Incentive spirometry 3) Transition to oral pain medication 4) Dulcolax suppository 5) D/C pelvic drain 6) Plan for likely discharge later today      LOS: 1 day   YARBROUGH,Cadey Bazile G. 07/06/2013, 7:51 AM

## 2013-07-06 NOTE — Discharge Summary (Signed)
Physician Discharge Summary  Patient ID: Robert Summers MRN: [id number] DOB/AGE: August 07, 1940 73 y.o.  Admit date: 07/05/2013 Discharge date: 07/06/2013  Admission Diagnoses: Malignant neoplasm of prostate  Discharge Diagnoses:  Active Problems:   Malignant neoplasm of prostate   Discharged Condition: good  Hospital Course:   1 - Prostate Cancer - Pt underwent robotic radical prostatectomy on 07/05/2013, the day of admission, without acute complications. He was admitted to the 4th floor Urology service post-op where he began his vigorous recovery. By POD 1, the day of discharge, he was ambulatory, pain controlled, tollerating PO intake and felt to be adequate for discharge. His post-surgical JP drain output was <20cc in 24 hrs therefore removed prior to discharge.  Consults: None  Significant Diagnostic Studies: labs: pathology - pending at discharge  Treatments: surgery: robotic radical prostatectomy on 07/05/2013  Discharge Exam: Blood pressure 120/58, pulse 66, temperature 98.7 F (37.1 C), temperature source Oral, resp. rate 18, height 5\' 10"  (1.778 m), weight 110.5 kg (243 lb 9.7 oz), SpO2 96.00%. General appearance: alert, cooperative, appears stated age and family at bedside Head: Normocephalic, without obvious abnormality, atraumatic Eyes: conjunctivae/corneas clear. PERRL, EOM's intact. Fundi benign. Ears: normal TM's and external ear canals both ears Nose: Nares normal. Septum midline. Mucosa normal. No drainage or sinus tenderness. Throat: lips, mucosa, and tongue normal; teeth and gums normal Neck: no adenopathy, no carotid bruit, no JVD, supple, symmetrical, trachea midline and thyroid not enlarged, symmetric, no tenderness/mass/nodules Back: symmetric, no curvature. ROM normal. No CVA tenderness. Resp: clear to auscultation bilaterally Chest wall: no tenderness Breasts: normal appearance, no masses or tenderness Cardio: regular rate and rhythm, S1, S2 normal, no murmur,  click, rub or gallop GI: soft, non-tender; bowel sounds normal; no masses,  no organomegaly Male genitalia: normal, foley c/d/i with very light pink urine, no clots. Extremities: extremities normal, atraumatic, no cyanosis or edema Pulses: 2+ and symmetric Skin: Skin color, texture, turgor normal. No rashes or lesions Lymph nodes: Cervical, supraclavicular, and axillary nodes normal. Neurologic: Grossly normal Incision/Wound: Recent port sites / extraction sites c/d/i. JP with very scant serous output, removed and dry dressing applied.   Disposition: Final discharge disposition not confirmed     Medication List    STOP taking these medications       aspirin 81 MG tablet     multivitamin with minerals Tabs tablet      TAKE these medications       ciprofloxacin 500 MG tablet  Commonly known as:  CIPRO  Take 1 tablet (500 mg total) by mouth 2 (two) times daily. Start day prior to office visit for foley removal     HYDROcodone-acetaminophen 5-325 MG per tablet  Commonly known as:  NORCO  Take 1-2 tablets by mouth every 6 (six) hours as needed.           Follow-up Information   Follow up with Malka So, MD On 07/14/2013. (at 8:45)    Specialty:  Urology   Contact information:   [facility]  [place] [address] [phone]       Signed: Alexis Frock 07/06/2013, 4:37 PM

## 2013-07-06 NOTE — Progress Notes (Signed)
Utilization review completed.  

## 2013-08-09 DIAGNOSIS — R279 Unspecified lack of coordination: Secondary | ICD-10-CM | POA: Diagnosis not present

## 2013-08-09 DIAGNOSIS — C61 Malignant neoplasm of prostate: Secondary | ICD-10-CM | POA: Diagnosis not present

## 2013-08-09 DIAGNOSIS — N393 Stress incontinence (female) (male): Secondary | ICD-10-CM | POA: Diagnosis not present

## 2013-08-09 DIAGNOSIS — M6281 Muscle weakness (generalized): Secondary | ICD-10-CM | POA: Diagnosis not present

## 2013-08-18 DIAGNOSIS — C61 Malignant neoplasm of prostate: Secondary | ICD-10-CM | POA: Diagnosis not present

## 2013-08-18 DIAGNOSIS — R82998 Other abnormal findings in urine: Secondary | ICD-10-CM | POA: Diagnosis not present

## 2013-08-23 DIAGNOSIS — M6281 Muscle weakness (generalized): Secondary | ICD-10-CM | POA: Diagnosis not present

## 2013-08-23 DIAGNOSIS — N393 Stress incontinence (female) (male): Secondary | ICD-10-CM | POA: Diagnosis not present

## 2013-08-23 DIAGNOSIS — R279 Unspecified lack of coordination: Secondary | ICD-10-CM | POA: Diagnosis not present

## 2013-08-30 DIAGNOSIS — D235 Other benign neoplasm of skin of trunk: Secondary | ICD-10-CM | POA: Diagnosis not present

## 2013-08-30 DIAGNOSIS — L570 Actinic keratosis: Secondary | ICD-10-CM | POA: Diagnosis not present

## 2013-09-19 DIAGNOSIS — N393 Stress incontinence (female) (male): Secondary | ICD-10-CM | POA: Diagnosis not present

## 2013-09-19 DIAGNOSIS — R279 Unspecified lack of coordination: Secondary | ICD-10-CM | POA: Diagnosis not present

## 2013-09-19 DIAGNOSIS — E119 Type 2 diabetes mellitus without complications: Secondary | ICD-10-CM | POA: Diagnosis not present

## 2013-09-19 DIAGNOSIS — M6281 Muscle weakness (generalized): Secondary | ICD-10-CM | POA: Diagnosis not present

## 2013-10-09 DIAGNOSIS — C61 Malignant neoplasm of prostate: Secondary | ICD-10-CM | POA: Diagnosis not present

## 2013-10-09 DIAGNOSIS — M6281 Muscle weakness (generalized): Secondary | ICD-10-CM | POA: Diagnosis not present

## 2013-10-09 DIAGNOSIS — N393 Stress incontinence (female) (male): Secondary | ICD-10-CM | POA: Diagnosis not present

## 2013-10-09 DIAGNOSIS — R279 Unspecified lack of coordination: Secondary | ICD-10-CM | POA: Diagnosis not present

## 2013-10-16 DIAGNOSIS — N393 Stress incontinence (female) (male): Secondary | ICD-10-CM | POA: Diagnosis not present

## 2013-10-16 DIAGNOSIS — N529 Male erectile dysfunction, unspecified: Secondary | ICD-10-CM | POA: Diagnosis not present

## 2013-10-16 DIAGNOSIS — C61 Malignant neoplasm of prostate: Secondary | ICD-10-CM | POA: Diagnosis not present

## 2013-10-16 DIAGNOSIS — R972 Elevated prostate specific antigen [PSA]: Secondary | ICD-10-CM | POA: Diagnosis not present

## 2013-12-06 DIAGNOSIS — B009 Herpesviral infection, unspecified: Secondary | ICD-10-CM | POA: Diagnosis not present

## 2013-12-11 DIAGNOSIS — L570 Actinic keratosis: Secondary | ICD-10-CM | POA: Diagnosis not present

## 2013-12-11 DIAGNOSIS — L905 Scar conditions and fibrosis of skin: Secondary | ICD-10-CM | POA: Diagnosis not present

## 2013-12-11 DIAGNOSIS — D235 Other benign neoplasm of skin of trunk: Secondary | ICD-10-CM | POA: Diagnosis not present

## 2014-01-04 DIAGNOSIS — H6120 Impacted cerumen, unspecified ear: Secondary | ICD-10-CM | POA: Diagnosis not present

## 2014-01-04 DIAGNOSIS — H902 Conductive hearing loss, unspecified: Secondary | ICD-10-CM | POA: Diagnosis not present

## 2014-01-10 DIAGNOSIS — M1710 Unilateral primary osteoarthritis, unspecified knee: Secondary | ICD-10-CM | POA: Diagnosis not present

## 2014-01-10 DIAGNOSIS — M545 Low back pain, unspecified: Secondary | ICD-10-CM | POA: Diagnosis not present

## 2014-01-12 DIAGNOSIS — C61 Malignant neoplasm of prostate: Secondary | ICD-10-CM | POA: Diagnosis not present

## 2014-01-19 DIAGNOSIS — Z8546 Personal history of malignant neoplasm of prostate: Secondary | ICD-10-CM | POA: Diagnosis not present

## 2014-01-19 DIAGNOSIS — N529 Male erectile dysfunction, unspecified: Secondary | ICD-10-CM | POA: Diagnosis not present

## 2014-01-19 DIAGNOSIS — M545 Low back pain, unspecified: Secondary | ICD-10-CM | POA: Diagnosis not present

## 2014-01-25 DIAGNOSIS — M6281 Muscle weakness (generalized): Secondary | ICD-10-CM | POA: Diagnosis not present

## 2014-01-25 DIAGNOSIS — M545 Low back pain, unspecified: Secondary | ICD-10-CM | POA: Diagnosis not present

## 2014-01-30 DIAGNOSIS — M48061 Spinal stenosis, lumbar region without neurogenic claudication: Secondary | ICD-10-CM | POA: Diagnosis not present

## 2014-02-07 ENCOUNTER — Ambulatory Visit: Payer: Medicare Other | Admitting: Physical Therapy

## 2014-02-14 DIAGNOSIS — M48061 Spinal stenosis, lumbar region without neurogenic claudication: Secondary | ICD-10-CM | POA: Diagnosis not present

## 2014-02-20 DIAGNOSIS — M48061 Spinal stenosis, lumbar region without neurogenic claudication: Secondary | ICD-10-CM | POA: Diagnosis not present

## 2014-03-09 DIAGNOSIS — M9983 Other biomechanical lesions of lumbar region: Secondary | ICD-10-CM | POA: Diagnosis not present

## 2014-03-27 DIAGNOSIS — M1811 Unilateral primary osteoarthritis of first carpometacarpal joint, right hand: Secondary | ICD-10-CM | POA: Diagnosis not present

## 2014-05-03 DIAGNOSIS — M1811 Unilateral primary osteoarthritis of first carpometacarpal joint, right hand: Secondary | ICD-10-CM | POA: Diagnosis not present

## 2014-05-21 DIAGNOSIS — L820 Inflamed seborrheic keratosis: Secondary | ICD-10-CM | POA: Diagnosis not present

## 2014-05-21 DIAGNOSIS — L570 Actinic keratosis: Secondary | ICD-10-CM | POA: Diagnosis not present

## 2014-05-21 DIAGNOSIS — D225 Melanocytic nevi of trunk: Secondary | ICD-10-CM | POA: Diagnosis not present

## 2014-05-21 DIAGNOSIS — X32XXXD Exposure to sunlight, subsequent encounter: Secondary | ICD-10-CM | POA: Diagnosis not present

## 2014-05-23 DIAGNOSIS — Z1211 Encounter for screening for malignant neoplasm of colon: Secondary | ICD-10-CM | POA: Diagnosis not present

## 2014-05-23 DIAGNOSIS — M069 Rheumatoid arthritis, unspecified: Secondary | ICD-10-CM | POA: Diagnosis not present

## 2014-05-23 DIAGNOSIS — C61 Malignant neoplasm of prostate: Secondary | ICD-10-CM | POA: Diagnosis not present

## 2014-05-23 DIAGNOSIS — E119 Type 2 diabetes mellitus without complications: Secondary | ICD-10-CM | POA: Diagnosis not present

## 2014-05-23 DIAGNOSIS — Z1389 Encounter for screening for other disorder: Secondary | ICD-10-CM | POA: Diagnosis not present

## 2014-05-23 DIAGNOSIS — E782 Mixed hyperlipidemia: Secondary | ICD-10-CM | POA: Diagnosis not present

## 2014-05-23 DIAGNOSIS — Z0000 Encounter for general adult medical examination without abnormal findings: Secondary | ICD-10-CM | POA: Diagnosis not present

## 2014-06-15 DIAGNOSIS — Z1211 Encounter for screening for malignant neoplasm of colon: Secondary | ICD-10-CM | POA: Diagnosis not present

## 2014-07-18 DIAGNOSIS — Z8546 Personal history of malignant neoplasm of prostate: Secondary | ICD-10-CM | POA: Diagnosis not present

## 2014-07-18 DIAGNOSIS — C61 Malignant neoplasm of prostate: Secondary | ICD-10-CM | POA: Diagnosis not present

## 2014-07-30 DIAGNOSIS — Z8546 Personal history of malignant neoplasm of prostate: Secondary | ICD-10-CM | POA: Diagnosis not present

## 2014-08-13 DIAGNOSIS — D225 Melanocytic nevi of trunk: Secondary | ICD-10-CM | POA: Diagnosis not present

## 2014-08-13 DIAGNOSIS — L570 Actinic keratosis: Secondary | ICD-10-CM | POA: Diagnosis not present

## 2014-08-13 DIAGNOSIS — L603 Nail dystrophy: Secondary | ICD-10-CM | POA: Diagnosis not present

## 2014-08-13 DIAGNOSIS — X32XXXD Exposure to sunlight, subsequent encounter: Secondary | ICD-10-CM | POA: Diagnosis not present

## 2014-08-31 DIAGNOSIS — R21 Rash and other nonspecific skin eruption: Secondary | ICD-10-CM | POA: Diagnosis not present

## 2014-08-31 DIAGNOSIS — H5710 Ocular pain, unspecified eye: Secondary | ICD-10-CM | POA: Diagnosis not present

## 2014-08-31 DIAGNOSIS — R51 Headache: Secondary | ICD-10-CM | POA: Diagnosis not present

## 2014-09-01 DIAGNOSIS — B029 Zoster without complications: Secondary | ICD-10-CM | POA: Diagnosis not present

## 2014-09-03 DIAGNOSIS — B0231 Zoster conjunctivitis: Secondary | ICD-10-CM | POA: Diagnosis not present

## 2014-09-19 DIAGNOSIS — B029 Zoster without complications: Secondary | ICD-10-CM | POA: Diagnosis not present

## 2014-09-24 DIAGNOSIS — B0230 Zoster ocular disease, unspecified: Secondary | ICD-10-CM | POA: Diagnosis not present

## 2014-09-28 DIAGNOSIS — M792 Neuralgia and neuritis, unspecified: Secondary | ICD-10-CM | POA: Diagnosis not present

## 2014-10-16 DIAGNOSIS — B029 Zoster without complications: Secondary | ICD-10-CM | POA: Diagnosis not present

## 2014-11-05 DIAGNOSIS — E1122 Type 2 diabetes mellitus with diabetic chronic kidney disease: Secondary | ICD-10-CM | POA: Diagnosis not present

## 2014-11-13 DIAGNOSIS — B029 Zoster without complications: Secondary | ICD-10-CM | POA: Diagnosis not present

## 2014-11-26 DIAGNOSIS — L820 Inflamed seborrheic keratosis: Secondary | ICD-10-CM | POA: Diagnosis not present

## 2014-11-26 DIAGNOSIS — B029 Zoster without complications: Secondary | ICD-10-CM | POA: Diagnosis not present

## 2014-12-10 DIAGNOSIS — N289 Disorder of kidney and ureter, unspecified: Secondary | ICD-10-CM | POA: Diagnosis not present

## 2014-12-17 DIAGNOSIS — E119 Type 2 diabetes mellitus without complications: Secondary | ICD-10-CM | POA: Diagnosis not present

## 2015-01-31 DIAGNOSIS — Z8546 Personal history of malignant neoplasm of prostate: Secondary | ICD-10-CM | POA: Diagnosis not present

## 2015-02-07 DIAGNOSIS — Z8546 Personal history of malignant neoplasm of prostate: Secondary | ICD-10-CM | POA: Diagnosis not present

## 2015-02-07 DIAGNOSIS — N3941 Urge incontinence: Secondary | ICD-10-CM | POA: Diagnosis not present

## 2015-02-07 DIAGNOSIS — N5231 Erectile dysfunction following radical prostatectomy: Secondary | ICD-10-CM | POA: Diagnosis not present

## 2015-02-11 DIAGNOSIS — M25551 Pain in right hip: Secondary | ICD-10-CM | POA: Diagnosis not present

## 2015-02-11 DIAGNOSIS — M1712 Unilateral primary osteoarthritis, left knee: Secondary | ICD-10-CM | POA: Diagnosis not present

## 2015-02-19 DIAGNOSIS — S80812A Abrasion, left lower leg, initial encounter: Secondary | ICD-10-CM | POA: Diagnosis not present

## 2015-02-19 DIAGNOSIS — Z23 Encounter for immunization: Secondary | ICD-10-CM | POA: Diagnosis not present

## 2015-02-19 DIAGNOSIS — L0390 Cellulitis, unspecified: Secondary | ICD-10-CM | POA: Diagnosis not present

## 2015-04-15 DIAGNOSIS — L570 Actinic keratosis: Secondary | ICD-10-CM | POA: Diagnosis not present

## 2015-04-15 DIAGNOSIS — X32XXXD Exposure to sunlight, subsequent encounter: Secondary | ICD-10-CM | POA: Diagnosis not present

## 2015-04-19 DIAGNOSIS — H6123 Impacted cerumen, bilateral: Secondary | ICD-10-CM | POA: Diagnosis not present

## 2015-05-28 DIAGNOSIS — D692 Other nonthrombocytopenic purpura: Secondary | ICD-10-CM | POA: Diagnosis not present

## 2015-05-28 DIAGNOSIS — R809 Proteinuria, unspecified: Secondary | ICD-10-CM | POA: Diagnosis not present

## 2015-05-28 DIAGNOSIS — G479 Sleep disorder, unspecified: Secondary | ICD-10-CM | POA: Diagnosis not present

## 2015-05-28 DIAGNOSIS — E782 Mixed hyperlipidemia: Secondary | ICD-10-CM | POA: Diagnosis not present

## 2015-05-28 DIAGNOSIS — Z1389 Encounter for screening for other disorder: Secondary | ICD-10-CM | POA: Diagnosis not present

## 2015-05-28 DIAGNOSIS — N183 Chronic kidney disease, stage 3 (moderate): Secondary | ICD-10-CM | POA: Diagnosis not present

## 2015-05-28 DIAGNOSIS — Z1211 Encounter for screening for malignant neoplasm of colon: Secondary | ICD-10-CM | POA: Diagnosis not present

## 2015-05-28 DIAGNOSIS — C61 Malignant neoplasm of prostate: Secondary | ICD-10-CM | POA: Diagnosis not present

## 2015-05-28 DIAGNOSIS — M069 Rheumatoid arthritis, unspecified: Secondary | ICD-10-CM | POA: Diagnosis not present

## 2015-05-28 DIAGNOSIS — E1129 Type 2 diabetes mellitus with other diabetic kidney complication: Secondary | ICD-10-CM | POA: Diagnosis not present

## 2015-05-28 DIAGNOSIS — Z0000 Encounter for general adult medical examination without abnormal findings: Secondary | ICD-10-CM | POA: Diagnosis not present

## 2015-05-28 DIAGNOSIS — Z23 Encounter for immunization: Secondary | ICD-10-CM | POA: Diagnosis not present

## 2015-06-12 DIAGNOSIS — Z1211 Encounter for screening for malignant neoplasm of colon: Secondary | ICD-10-CM | POA: Diagnosis not present

## 2015-08-26 DIAGNOSIS — Z8546 Personal history of malignant neoplasm of prostate: Secondary | ICD-10-CM | POA: Diagnosis not present

## 2015-09-02 DIAGNOSIS — Z0000 Encounter for general adult medical examination without abnormal findings: Secondary | ICD-10-CM | POA: Diagnosis not present

## 2015-09-02 DIAGNOSIS — N3941 Urge incontinence: Secondary | ICD-10-CM | POA: Diagnosis not present

## 2015-09-02 DIAGNOSIS — Z8546 Personal history of malignant neoplasm of prostate: Secondary | ICD-10-CM | POA: Diagnosis not present

## 2015-09-02 DIAGNOSIS — N5231 Erectile dysfunction following radical prostatectomy: Secondary | ICD-10-CM | POA: Diagnosis not present

## 2015-11-06 DIAGNOSIS — L570 Actinic keratosis: Secondary | ICD-10-CM | POA: Diagnosis not present

## 2015-11-06 DIAGNOSIS — D225 Melanocytic nevi of trunk: Secondary | ICD-10-CM | POA: Diagnosis not present

## 2015-11-06 DIAGNOSIS — X32XXXD Exposure to sunlight, subsequent encounter: Secondary | ICD-10-CM | POA: Diagnosis not present

## 2015-11-26 DIAGNOSIS — E1129 Type 2 diabetes mellitus with other diabetic kidney complication: Secondary | ICD-10-CM | POA: Diagnosis not present

## 2015-12-17 DIAGNOSIS — H2513 Age-related nuclear cataract, bilateral: Secondary | ICD-10-CM | POA: Diagnosis not present

## 2015-12-17 DIAGNOSIS — Z0100 Encounter for examination of eyes and vision without abnormal findings: Secondary | ICD-10-CM | POA: Diagnosis not present

## 2016-02-03 DIAGNOSIS — R21 Rash and other nonspecific skin eruption: Secondary | ICD-10-CM | POA: Diagnosis not present

## 2016-02-12 DIAGNOSIS — Z8546 Personal history of malignant neoplasm of prostate: Secondary | ICD-10-CM | POA: Diagnosis not present

## 2016-03-17 DIAGNOSIS — Z8546 Personal history of malignant neoplasm of prostate: Secondary | ICD-10-CM | POA: Diagnosis not present

## 2016-03-25 DIAGNOSIS — L820 Inflamed seborrheic keratosis: Secondary | ICD-10-CM | POA: Diagnosis not present

## 2016-03-25 DIAGNOSIS — L570 Actinic keratosis: Secondary | ICD-10-CM | POA: Diagnosis not present

## 2016-03-25 DIAGNOSIS — X32XXXD Exposure to sunlight, subsequent encounter: Secondary | ICD-10-CM | POA: Diagnosis not present

## 2016-05-20 DIAGNOSIS — J069 Acute upper respiratory infection, unspecified: Secondary | ICD-10-CM | POA: Diagnosis not present

## 2016-06-03 DIAGNOSIS — H6123 Impacted cerumen, bilateral: Secondary | ICD-10-CM | POA: Diagnosis not present

## 2016-06-03 DIAGNOSIS — J018 Other acute sinusitis: Secondary | ICD-10-CM | POA: Diagnosis not present

## 2016-06-08 DIAGNOSIS — E669 Obesity, unspecified: Secondary | ICD-10-CM | POA: Diagnosis not present

## 2016-06-08 DIAGNOSIS — N183 Chronic kidney disease, stage 3 (moderate): Secondary | ICD-10-CM | POA: Diagnosis not present

## 2016-06-08 DIAGNOSIS — R05 Cough: Secondary | ICD-10-CM | POA: Diagnosis not present

## 2016-06-08 DIAGNOSIS — D692 Other nonthrombocytopenic purpura: Secondary | ICD-10-CM | POA: Diagnosis not present

## 2016-06-08 DIAGNOSIS — E782 Mixed hyperlipidemia: Secondary | ICD-10-CM | POA: Diagnosis not present

## 2016-06-08 DIAGNOSIS — Z8546 Personal history of malignant neoplasm of prostate: Secondary | ICD-10-CM | POA: Diagnosis not present

## 2016-06-08 DIAGNOSIS — R809 Proteinuria, unspecified: Secondary | ICD-10-CM | POA: Diagnosis not present

## 2016-06-08 DIAGNOSIS — Z1389 Encounter for screening for other disorder: Secondary | ICD-10-CM | POA: Diagnosis not present

## 2016-06-08 DIAGNOSIS — E1129 Type 2 diabetes mellitus with other diabetic kidney complication: Secondary | ICD-10-CM | POA: Diagnosis not present

## 2016-06-08 DIAGNOSIS — Z1211 Encounter for screening for malignant neoplasm of colon: Secondary | ICD-10-CM | POA: Diagnosis not present

## 2016-06-08 DIAGNOSIS — M069 Rheumatoid arthritis, unspecified: Secondary | ICD-10-CM | POA: Diagnosis not present

## 2016-06-08 DIAGNOSIS — Z0000 Encounter for general adult medical examination without abnormal findings: Secondary | ICD-10-CM | POA: Diagnosis not present

## 2016-06-24 DIAGNOSIS — Z1211 Encounter for screening for malignant neoplasm of colon: Secondary | ICD-10-CM | POA: Diagnosis not present

## 2016-08-11 DIAGNOSIS — K449 Diaphragmatic hernia without obstruction or gangrene: Secondary | ICD-10-CM | POA: Diagnosis not present

## 2016-08-11 DIAGNOSIS — Z8546 Personal history of malignant neoplasm of prostate: Secondary | ICD-10-CM | POA: Diagnosis not present

## 2016-08-11 DIAGNOSIS — R7303 Prediabetes: Secondary | ICD-10-CM | POA: Diagnosis not present

## 2016-08-11 DIAGNOSIS — E1122 Type 2 diabetes mellitus with diabetic chronic kidney disease: Secondary | ICD-10-CM | POA: Insufficient documentation

## 2016-08-11 DIAGNOSIS — E669 Obesity, unspecified: Secondary | ICD-10-CM | POA: Diagnosis not present

## 2016-10-19 DIAGNOSIS — R21 Rash and other nonspecific skin eruption: Secondary | ICD-10-CM | POA: Diagnosis not present

## 2016-10-21 DIAGNOSIS — Z8546 Personal history of malignant neoplasm of prostate: Secondary | ICD-10-CM | POA: Diagnosis not present

## 2016-10-28 DIAGNOSIS — Z8546 Personal history of malignant neoplasm of prostate: Secondary | ICD-10-CM | POA: Diagnosis not present

## 2016-11-11 DIAGNOSIS — M7542 Impingement syndrome of left shoulder: Secondary | ICD-10-CM | POA: Diagnosis not present

## 2016-12-14 DIAGNOSIS — R7303 Prediabetes: Secondary | ICD-10-CM | POA: Diagnosis not present

## 2016-12-14 DIAGNOSIS — Z23 Encounter for immunization: Secondary | ICD-10-CM | POA: Diagnosis not present

## 2016-12-17 DIAGNOSIS — E119 Type 2 diabetes mellitus without complications: Secondary | ICD-10-CM | POA: Diagnosis not present

## 2016-12-17 DIAGNOSIS — H524 Presbyopia: Secondary | ICD-10-CM | POA: Diagnosis not present

## 2016-12-17 DIAGNOSIS — H2513 Age-related nuclear cataract, bilateral: Secondary | ICD-10-CM | POA: Diagnosis not present

## 2017-01-18 DIAGNOSIS — L570 Actinic keratosis: Secondary | ICD-10-CM | POA: Diagnosis not present

## 2017-01-18 DIAGNOSIS — X32XXXD Exposure to sunlight, subsequent encounter: Secondary | ICD-10-CM | POA: Diagnosis not present

## 2017-04-14 DIAGNOSIS — Z8546 Personal history of malignant neoplasm of prostate: Secondary | ICD-10-CM | POA: Diagnosis not present

## 2017-04-21 DIAGNOSIS — N5231 Erectile dysfunction following radical prostatectomy: Secondary | ICD-10-CM | POA: Diagnosis not present

## 2017-04-21 DIAGNOSIS — N393 Stress incontinence (female) (male): Secondary | ICD-10-CM | POA: Diagnosis not present

## 2017-04-21 DIAGNOSIS — N3941 Urge incontinence: Secondary | ICD-10-CM | POA: Diagnosis not present

## 2017-04-21 DIAGNOSIS — Z8546 Personal history of malignant neoplasm of prostate: Secondary | ICD-10-CM | POA: Diagnosis not present

## 2017-04-26 DIAGNOSIS — M7542 Impingement syndrome of left shoulder: Secondary | ICD-10-CM | POA: Diagnosis not present

## 2017-05-01 DIAGNOSIS — M7542 Impingement syndrome of left shoulder: Secondary | ICD-10-CM | POA: Diagnosis not present

## 2017-05-17 DIAGNOSIS — M75122 Complete rotator cuff tear or rupture of left shoulder, not specified as traumatic: Secondary | ICD-10-CM | POA: Diagnosis not present

## 2017-06-11 DIAGNOSIS — G8918 Other acute postprocedural pain: Secondary | ICD-10-CM | POA: Diagnosis not present

## 2017-06-11 DIAGNOSIS — M19012 Primary osteoarthritis, left shoulder: Secondary | ICD-10-CM | POA: Diagnosis not present

## 2017-06-11 DIAGNOSIS — M7522 Bicipital tendinitis, left shoulder: Secondary | ICD-10-CM | POA: Diagnosis not present

## 2017-06-11 DIAGNOSIS — M75122 Complete rotator cuff tear or rupture of left shoulder, not specified as traumatic: Secondary | ICD-10-CM | POA: Diagnosis not present

## 2017-06-11 DIAGNOSIS — M659 Synovitis and tenosynovitis, unspecified: Secondary | ICD-10-CM | POA: Diagnosis not present

## 2017-06-11 DIAGNOSIS — S43431A Superior glenoid labrum lesion of right shoulder, initial encounter: Secondary | ICD-10-CM | POA: Diagnosis not present

## 2017-06-11 DIAGNOSIS — M7542 Impingement syndrome of left shoulder: Secondary | ICD-10-CM | POA: Diagnosis not present

## 2017-06-19 ENCOUNTER — Emergency Department (HOSPITAL_BASED_OUTPATIENT_CLINIC_OR_DEPARTMENT_OTHER): Admit: 2017-06-19 | Discharge: 2017-06-19 | Disposition: A | Discharge status: Home / Self Care | Payer: Medicare Other

## 2017-06-19 ENCOUNTER — Emergency Department (HOSPITAL_COMMUNITY)
Admission: EM | Admit: 2017-06-19 | Discharge: 2017-06-19 | Disposition: A | Discharge status: Home / Self Care | Payer: Medicare Other | Attending: Emergency Medicine | Admitting: Emergency Medicine

## 2017-06-19 DIAGNOSIS — R600 Localized edema: Secondary | ICD-10-CM | POA: Diagnosis not present

## 2017-06-19 DIAGNOSIS — R609 Edema, unspecified: Secondary | ICD-10-CM | POA: Diagnosis not present

## 2017-06-19 DIAGNOSIS — Z87891 Personal history of nicotine dependence: Secondary | ICD-10-CM | POA: Insufficient documentation

## 2017-06-19 DIAGNOSIS — M7989 Other specified soft tissue disorders: Secondary | ICD-10-CM

## 2017-06-19 DIAGNOSIS — Z79899 Other long term (current) drug therapy: Secondary | ICD-10-CM | POA: Insufficient documentation

## 2017-06-19 MED ORDER — FUROSEMIDE 20 MG PO TABS: 40.0000 mg | ORAL_TABLET | Freq: Every day | ORAL | 0 refills | Status: DC

## 2017-06-19 NOTE — Discharge Instructions (Signed)
You were seen here today for lower extremity swelling. You had ultrasounds of your lower extremities bilaterally that did not show evidence of a blood clot. I am starting you an a medication called lasix for the next 5 days. Please follow up with your PCP at this point to ensure your symptoms are improving.  Please wear compression stockings.  Please elevate your legs whenever you are not ambulating.  Please make sure that you are ambulating often.  If you develop any chest pain, shortness of breath or a productive cough or new/worsening symptoms you may return to the emergency department.

## 2017-06-19 NOTE — ED Provider Notes (Signed)
[facility] EMERGENCY DEPARTMENT Provider Note   CSN: [id number] Arrival date & time: 06/19/17  1148     History   Chief Complaint Chief Complaint  Patient presents with  . Leg Swelling    HPI Robert Summers is a 77 y.o. male who presents the emergency department today for bilateral lower extremity swelling over the last 5 days.  Patient states that he recently underwent a left-sided rotator cuff surgery approximately 8 days ago.  He was discharged home on naproxen medication that he was taking as prescribed.  He started noticing right lower extremity edema the following Monday.  He looked at the side effects for this and noted that peripheral swelling was a side effect of this medication.  He called his orthopedist to inform him that he would stop taking his medication as he thought it was secondary to his naproxen use.  He notes that since the surgery he has been eating very salty meals including soup.  He also notes that he has not been ambulating as often as normal.  There is no pain or ulcerations associate with this.  No trauma.  He denies any previous DVT or PE or history of CHF.  No associated chest pain, cough or shortness of breath. He is not currently on any anti-diuretics.   HPI  Past Medical History:  Diagnosis Date  . Arthritis    KNEES  . GERD (gastroesophageal reflux disease)    OCCASIONAL ONLY  . H/O hiatal hernia   . History of gout   . History of nonmelanoma skin cancer   . Prediabetes   . Prostate cancer     Patient Active Problem List   Diagnosis Date Noted  . Malignant neoplasm of prostate ([place]) 07/05/2013    Past Surgical History:  Procedure Laterality Date  . CIRCUMCISION    . ROBOT ASSISTED LAPAROSCOPIC RADICAL PROSTATECTOMY N/A 07/05/2013   Procedure: ROBOTIC ASSISTED LAPAROSCOPIC NERVE SPARING RADICAL PROSTATECTOMY;  Surgeon: Irine Seal, MD;  Location: [facility];  Service: Urology;  Laterality: N/A;  . SPERMATACELE     REPAIRED    . WRIST SURGERY     RT       Home Medications    Prior to Admission medications   Medication Sig Start Date End Date Taking? Authorizing Provider  ciprofloxacin (CIPRO) 500 MG tablet Take 1 tablet (500 mg total) by mouth 2 (two) times daily. Start day prior to office visit for foley removal 07/05/13   Debbrah Alar, PA-C  HYDROcodone-acetaminophen (NORCO) 5-325 MG per tablet Take 1-2 tablets by mouth every 6 (six) hours as needed. 07/05/13   Debbrah Alar, PA-C    Family History No family history on file.  Social History Social History   Tobacco Use  . Smoking status: Former Research scientist (life sciences)  . Smokeless tobacco: Former Systems developer    Quit date: 06/28/1962  Substance Use Topics  . Alcohol use: Yes    Comment: RARE  . Drug use: No     Allergies   Patient has no known allergies.   Review of Systems Review of Systems  All other systems reviewed and are negative.    Physical Exam Updated Vital Signs BP (!) 142/72   Pulse (!) 56   Temp 98.4 F (36.9 C) (Oral)   Resp 17   SpO2 96%   Physical Exam  Constitutional: He appears well-developed and well-nourished.  HENT:  Head: Normocephalic and atraumatic.  Right Ear: External ear normal.  Left Ear: External ear normal.  Nose: Nose normal.  Mouth/Throat: Uvula is midline, oropharynx is clear and moist and mucous membranes are normal. No tonsillar exudate.  Eyes: Pupils are equal, round, and reactive to light. Right eye exhibits no discharge. Left eye exhibits no discharge. No scleral icterus.  Neck: Trachea normal. Neck supple. No spinous process tenderness present. No neck rigidity. Normal range of motion present.  Cardiovascular: Normal rate, regular rhythm and intact distal pulses.  No murmur heard. Pulses:      Radial pulses are 2+ on the right side, and 2+ on the left side.       Dorsalis pedis pulses are 2+ on the right side, and 2+ on the left side.       Posterior tibial pulses are 2+ on the right side, and 2+ on the left  side.  Bilateral 1+ pitting edema to mid-shin. No lower extremity TTP.   Pulmonary/Chest: Effort normal and breath sounds normal. He exhibits no tenderness.  Abdominal: Soft. Bowel sounds are normal. There is no tenderness. There is no rigidity, no rebound, no guarding and no CVA tenderness.  Musculoskeletal: He exhibits no edema.       Right knee: He exhibits normal range of motion.       Left knee: He exhibits normal range of motion.       Right ankle: He exhibits normal range of motion.       Left ankle: He exhibits normal range of motion.  Neurovascularly intact to the lower extremities. Compartments soft.   Lymphadenopathy:    He has no cervical adenopathy.  Neurological: He is alert.  Skin: Skin is warm, dry and intact. Capillary refill takes less than 2 seconds. No rash noted. He is not diaphoretic. No erythema.  No skin erythema, heat, or ulcerations.   Psychiatric: He has a normal mood and affect.  Nursing note and vitals reviewed.    ED Treatments / Results  Labs (all labs ordered are listed, but only abnormal results are displayed) Labs Reviewed - No data to display  EKG  EKG Interpretation None       Radiology No results found.  Procedures Procedures (including critical care time)  Medications Ordered in ED Medications - No data to display   Initial Impression / Assessment and Plan / ED Course  I have reviewed the triage vital signs and the nursing notes.  Pertinent labs & imaging results that were available during my care of the patient were reviewed by me and considered in my medical decision making (see chart for details).     77 year old male presenting for bilateral lower extremity swelling over the last 5 days.  Patient did undergo a recent rotator cuff surgery approximately 8 days ago.  He was medically screened prior to evaluation and had bilateral Doppler ultrasounds were negative for DVT.  Patient does note that he has been not ambulating,  elevating his legs and has had increased salt intake.  He denies any history of CHF, diuretic use, chest pain, shortness of breath or cough.  On exam the patient skin is intact.  He is neurovascularly intact.  There is bilateral lower extremity edema is 1+ to the level of the mid shin.  No skin changes including erythema, heat.  No tenderness to palpation over the lower extremities.  Patient has full range of motion of knees, ankles and digits of the lower extremity's bilaterally.  Do not suspect septic joint.  His vital signs are reassuring. The evaluation does not show pathology that would  require further workup, ongoing emergent intervention or inpatient treatment.  Will start the patient on short course of Lasix, advised compression stockings, ambulation and low salt intake.  I advised the patient to follow-up with PCP in the next 3 days. I advised the patient to return to the emergency department with new or worsening symptoms or new concerns. Specific return precautions discussed. The patient verbalized understanding and agreement with plan. All questions answered. No further questions at this time. The patient is hemodynamically stable, mentating appropriately and appears safe for discharge. Patient case discussed with Dr. Jeanell Sparrow who is in agreement with plan.  Final Clinical Impressions(s) / ED Diagnoses   Final diagnoses:  Bilateral lower extremity edema    ED Discharge Orders        Ordered    furosemide (LASIX) 20 MG tablet  Daily     06/19/17 1729       Lorelle Gibbs 06/19/17 1738    Pattricia Boss, MD 06/20/17 2225

## 2017-06-19 NOTE — ED Notes (Signed)
Pt and wife stating the are, "old and hungry and if it was fine for him to walk in here, it'll be fine for him to walk out." EDP made aware.

## 2017-06-19 NOTE — ED Triage Notes (Signed)
Pt had left rotator cuff repain 11th of Jan. Has noted gradual swelling to bilateral legs since procedure, no pain anywhere on legs. Significant swelling noted. No hx of CHF, no hx of blood clot. No shortness of breath.

## 2017-06-19 NOTE — Progress Notes (Signed)
Preliminary result by tech - Bilateral lower ext. Venous duplex completed. Negative for deep and superficial vein thrombosis. Oda Cogan, BS, RDMS, RVT

## 2017-06-19 NOTE — ED Notes (Signed)
This RN went to discharge pt; pt and wife refusing discharge instruction or vitals. Pt given discharge papers and left.

## 2017-06-19 NOTE — ED Provider Notes (Signed)
MSE was initiated and I personally evaluated the patient and placed orders (if any) at  12:25 PM on June 19, 2017.  Patient presents to ED for evaluation of bilateral lower leg swelling for the past 5 days.  He underwent a rotator cuff repair on the left side approximately 8 days ago.  He has been taking naproxen to help with his pain.  He has noticed progressively worsening bilateral lower extremity edema.  Informed his PCP of this and was told to discontinue the naproxen as his swelling could be secondary to the naproxen use.  He also reports eating a very salty meal several days ago which he states could have attributed to the swelling.  He reports mild bilateral leg swelling in the past related to salt use.  States that it is usually his left leg that is more swollen.  No previous history of DVT or PE, or CHF.  Denies any chest pain, hemoptysis.  On physical exam there is 1+ pitting edema in bilateral lower extremities with left slightly greater than the right.  There is no erythema or temperature change or no calf tenderness noted.  Although there is a very low likelihood for DVT to happen bilaterally or based on physical exam findings, it is difficult to exclude this based on patient's history and age group, as well as his recent surgery.  Will order bilateral lower extremity ultrasounds to evaluate for DVT.  The patient appears stable so that the remainder of the MSE may be completed by another provider.   Delia Heady, PA-C 06/19/17 1227    Julianne Rice, MD 06/20/17 [phone]

## 2017-06-23 DIAGNOSIS — R600 Localized edema: Secondary | ICD-10-CM | POA: Diagnosis not present

## 2017-06-23 DIAGNOSIS — R7303 Prediabetes: Secondary | ICD-10-CM | POA: Diagnosis not present

## 2017-06-28 DIAGNOSIS — X32XXXD Exposure to sunlight, subsequent encounter: Secondary | ICD-10-CM | POA: Diagnosis not present

## 2017-06-28 DIAGNOSIS — L570 Actinic keratosis: Secondary | ICD-10-CM | POA: Diagnosis not present

## 2017-06-28 DIAGNOSIS — L308 Other specified dermatitis: Secondary | ICD-10-CM | POA: Diagnosis not present

## 2017-07-01 DIAGNOSIS — M25519 Pain in unspecified shoulder: Secondary | ICD-10-CM | POA: Diagnosis not present

## 2017-07-07 DIAGNOSIS — R600 Localized edema: Secondary | ICD-10-CM | POA: Diagnosis not present

## 2017-07-22 DIAGNOSIS — M25512 Pain in left shoulder: Secondary | ICD-10-CM | POA: Diagnosis not present

## 2017-07-26 DIAGNOSIS — M25512 Pain in left shoulder: Secondary | ICD-10-CM | POA: Diagnosis not present

## 2017-07-26 DIAGNOSIS — M25519 Pain in unspecified shoulder: Secondary | ICD-10-CM | POA: Diagnosis not present

## 2017-07-29 DIAGNOSIS — M25519 Pain in unspecified shoulder: Secondary | ICD-10-CM | POA: Diagnosis not present

## 2017-08-02 DIAGNOSIS — M25512 Pain in left shoulder: Secondary | ICD-10-CM | POA: Diagnosis not present

## 2017-08-05 DIAGNOSIS — M25519 Pain in unspecified shoulder: Secondary | ICD-10-CM | POA: Diagnosis not present

## 2017-08-09 DIAGNOSIS — M25519 Pain in unspecified shoulder: Secondary | ICD-10-CM | POA: Diagnosis not present

## 2017-08-12 DIAGNOSIS — M25512 Pain in left shoulder: Secondary | ICD-10-CM | POA: Diagnosis not present

## 2017-08-12 DIAGNOSIS — M25519 Pain in unspecified shoulder: Secondary | ICD-10-CM | POA: Diagnosis not present

## 2017-08-16 DIAGNOSIS — M25519 Pain in unspecified shoulder: Secondary | ICD-10-CM | POA: Diagnosis not present

## 2017-08-16 DIAGNOSIS — M25512 Pain in left shoulder: Secondary | ICD-10-CM | POA: Diagnosis not present

## 2017-08-18 DIAGNOSIS — J060 Acute laryngopharyngitis: Secondary | ICD-10-CM | POA: Diagnosis not present

## 2017-08-18 DIAGNOSIS — J0141 Acute recurrent pansinusitis: Secondary | ICD-10-CM | POA: Diagnosis not present

## 2017-08-18 DIAGNOSIS — J069 Acute upper respiratory infection, unspecified: Secondary | ICD-10-CM | POA: Diagnosis not present

## 2017-08-18 DIAGNOSIS — N183 Chronic kidney disease, stage 3 (moderate): Secondary | ICD-10-CM | POA: Diagnosis not present

## 2017-08-19 DIAGNOSIS — M25519 Pain in unspecified shoulder: Secondary | ICD-10-CM | POA: Diagnosis not present

## 2017-08-23 DIAGNOSIS — M25519 Pain in unspecified shoulder: Secondary | ICD-10-CM | POA: Diagnosis not present

## 2017-08-26 DIAGNOSIS — M25519 Pain in unspecified shoulder: Secondary | ICD-10-CM | POA: Diagnosis not present

## 2017-08-30 DIAGNOSIS — M25519 Pain in unspecified shoulder: Secondary | ICD-10-CM | POA: Diagnosis not present

## 2017-08-30 DIAGNOSIS — M25512 Pain in left shoulder: Secondary | ICD-10-CM | POA: Diagnosis not present

## 2017-09-01 DIAGNOSIS — M25512 Pain in left shoulder: Secondary | ICD-10-CM | POA: Diagnosis not present

## 2017-09-07 DIAGNOSIS — M25519 Pain in unspecified shoulder: Secondary | ICD-10-CM | POA: Diagnosis not present

## 2017-09-09 DIAGNOSIS — M5136 Other intervertebral disc degeneration, lumbar region: Secondary | ICD-10-CM | POA: Diagnosis not present

## 2017-09-10 DIAGNOSIS — M25512 Pain in left shoulder: Secondary | ICD-10-CM | POA: Diagnosis not present

## 2017-09-10 DIAGNOSIS — M25519 Pain in unspecified shoulder: Secondary | ICD-10-CM | POA: Diagnosis not present

## 2017-09-11 DIAGNOSIS — B029 Zoster without complications: Secondary | ICD-10-CM | POA: Diagnosis not present

## 2017-09-16 DIAGNOSIS — M25519 Pain in unspecified shoulder: Secondary | ICD-10-CM | POA: Diagnosis not present

## 2017-09-22 DIAGNOSIS — M5136 Other intervertebral disc degeneration, lumbar region: Secondary | ICD-10-CM | POA: Diagnosis not present

## 2017-09-28 DIAGNOSIS — R7301 Impaired fasting glucose: Secondary | ICD-10-CM | POA: Diagnosis not present

## 2017-09-28 DIAGNOSIS — R7303 Prediabetes: Secondary | ICD-10-CM | POA: Diagnosis not present

## 2017-09-28 DIAGNOSIS — Z125 Encounter for screening for malignant neoplasm of prostate: Secondary | ICD-10-CM | POA: Diagnosis not present

## 2017-09-28 DIAGNOSIS — R5383 Other fatigue: Secondary | ICD-10-CM | POA: Diagnosis not present

## 2017-09-28 DIAGNOSIS — R7989 Other specified abnormal findings of blood chemistry: Secondary | ICD-10-CM | POA: Diagnosis not present

## 2017-09-28 DIAGNOSIS — Z13220 Encounter for screening for lipoid disorders: Secondary | ICD-10-CM | POA: Diagnosis not present

## 2017-10-21 DIAGNOSIS — M5136 Other intervertebral disc degeneration, lumbar region: Secondary | ICD-10-CM | POA: Diagnosis not present

## 2017-12-21 DIAGNOSIS — E119 Type 2 diabetes mellitus without complications: Secondary | ICD-10-CM | POA: Diagnosis not present

## 2017-12-21 DIAGNOSIS — H2513 Age-related nuclear cataract, bilateral: Secondary | ICD-10-CM | POA: Diagnosis not present

## 2017-12-21 DIAGNOSIS — H5203 Hypermetropia, bilateral: Secondary | ICD-10-CM | POA: Diagnosis not present

## 2017-12-31 DIAGNOSIS — E7800 Pure hypercholesterolemia, unspecified: Secondary | ICD-10-CM | POA: Diagnosis not present

## 2018-01-03 DIAGNOSIS — H6123 Impacted cerumen, bilateral: Secondary | ICD-10-CM | POA: Diagnosis not present

## 2018-02-23 DIAGNOSIS — L570 Actinic keratosis: Secondary | ICD-10-CM | POA: Diagnosis not present

## 2018-02-23 DIAGNOSIS — X32XXXD Exposure to sunlight, subsequent encounter: Secondary | ICD-10-CM | POA: Diagnosis not present

## 2018-03-28 DIAGNOSIS — K449 Diaphragmatic hernia without obstruction or gangrene: Secondary | ICD-10-CM | POA: Diagnosis not present

## 2018-03-28 DIAGNOSIS — N183 Chronic kidney disease, stage 3 (moderate): Secondary | ICD-10-CM | POA: Diagnosis not present

## 2018-03-28 DIAGNOSIS — E1122 Type 2 diabetes mellitus with diabetic chronic kidney disease: Secondary | ICD-10-CM | POA: Diagnosis not present

## 2018-03-28 DIAGNOSIS — E7800 Pure hypercholesterolemia, unspecified: Secondary | ICD-10-CM | POA: Diagnosis not present

## 2018-04-18 DIAGNOSIS — Z8546 Personal history of malignant neoplasm of prostate: Secondary | ICD-10-CM | POA: Diagnosis not present

## 2018-04-19 DIAGNOSIS — L97509 Non-pressure chronic ulcer of other part of unspecified foot with unspecified severity: Secondary | ICD-10-CM | POA: Diagnosis not present

## 2018-04-19 DIAGNOSIS — L255 Unspecified contact dermatitis due to plants, except food: Secondary | ICD-10-CM | POA: Diagnosis not present

## 2018-04-25 DIAGNOSIS — Z8546 Personal history of malignant neoplasm of prostate: Secondary | ICD-10-CM | POA: Diagnosis not present

## 2018-04-25 DIAGNOSIS — N393 Stress incontinence (female) (male): Secondary | ICD-10-CM | POA: Diagnosis not present

## 2018-04-25 DIAGNOSIS — N5231 Erectile dysfunction following radical prostatectomy: Secondary | ICD-10-CM | POA: Diagnosis not present

## 2018-08-09 ENCOUNTER — Encounter (HOSPITAL_COMMUNITY): Payer: Self-pay | Admitting: Internal Medicine

## 2018-08-09 ENCOUNTER — Other Ambulatory Visit: Payer: Self-pay

## 2018-08-09 ENCOUNTER — Inpatient Hospital Stay (HOSPITAL_COMMUNITY)
Admission: EM | Admit: 2018-08-09 | Discharge: 2018-08-11 | DRG: 309 | Disposition: A | Discharge status: Home / Self Care | Payer: Medicare Other | Attending: Internal Medicine | Admitting: Internal Medicine

## 2018-08-09 ENCOUNTER — Emergency Department (HOSPITAL_COMMUNITY): Payer: Medicare Other

## 2018-08-09 DIAGNOSIS — E1122 Type 2 diabetes mellitus with diabetic chronic kidney disease: Secondary | ICD-10-CM | POA: Diagnosis present

## 2018-08-09 DIAGNOSIS — M109 Gout, unspecified: Secondary | ICD-10-CM | POA: Diagnosis present

## 2018-08-09 DIAGNOSIS — Z8249 Family history of ischemic heart disease and other diseases of the circulatory system: Secondary | ICD-10-CM

## 2018-08-09 DIAGNOSIS — I4891 Unspecified atrial fibrillation: Secondary | ICD-10-CM | POA: Diagnosis present

## 2018-08-09 DIAGNOSIS — K219 Gastro-esophageal reflux disease without esophagitis: Secondary | ICD-10-CM | POA: Diagnosis present

## 2018-08-09 DIAGNOSIS — I4892 Unspecified atrial flutter: Principal | ICD-10-CM

## 2018-08-09 DIAGNOSIS — Z8546 Personal history of malignant neoplasm of prostate: Secondary | ICD-10-CM

## 2018-08-09 DIAGNOSIS — N179 Acute kidney failure, unspecified: Secondary | ICD-10-CM | POA: Diagnosis present

## 2018-08-09 DIAGNOSIS — E663 Overweight: Secondary | ICD-10-CM | POA: Diagnosis present

## 2018-08-09 DIAGNOSIS — Z79899 Other long term (current) drug therapy: Secondary | ICD-10-CM

## 2018-08-09 DIAGNOSIS — R079 Chest pain, unspecified: Secondary | ICD-10-CM

## 2018-08-09 DIAGNOSIS — N183 Chronic kidney disease, stage 3 (moderate): Secondary | ICD-10-CM | POA: Diagnosis present

## 2018-08-09 DIAGNOSIS — Z7982 Long term (current) use of aspirin: Secondary | ICD-10-CM

## 2018-08-09 DIAGNOSIS — Z87891 Personal history of nicotine dependence: Secondary | ICD-10-CM

## 2018-08-09 DIAGNOSIS — Z6833 Body mass index (BMI) 33.0-33.9, adult: Secondary | ICD-10-CM

## 2018-08-09 DIAGNOSIS — E875 Hyperkalemia: Secondary | ICD-10-CM | POA: Diagnosis present

## 2018-08-09 DIAGNOSIS — Z85828 Personal history of other malignant neoplasm of skin: Secondary | ICD-10-CM

## 2018-08-09 HISTORY — DX: Chest pain, unspecified: R07.9

## 2018-08-09 HISTORY — DX: Unspecified atrial flutter: I48.92

## 2018-08-09 LAB — CBC WITH DIFFERENTIAL/PLATELET
Abs Immature Granulocytes: 0.02 10*3/uL (ref 0.00–0.07)
BASOS ABS: 0 10*3/uL (ref 0.0–0.1)
Basophils Relative: 0 %
Eosinophils Absolute: 0.1 10*3/uL (ref 0.0–0.5)
Eosinophils Relative: 1 %
HEMATOCRIT: 50 % (ref 39.0–52.0)
HEMOGLOBIN: 16.9 g/dL (ref 13.0–17.0)
Immature Granulocytes: 0 %
LYMPHS ABS: 1.8 10*3/uL (ref 0.7–4.0)
LYMPHS PCT: 20 %
MCH: 30.1 pg (ref 26.0–34.0)
MCHC: 33.8 g/dL (ref 30.0–36.0)
MCV: 89 fL (ref 80.0–100.0)
MONO ABS: 0.8 10*3/uL (ref 0.1–1.0)
MONOS PCT: 9 %
Neutro Abs: 6.3 10*3/uL (ref 1.7–7.7)
Neutrophils Relative %: 70 %
Platelets: 160 10*3/uL (ref 150–400)
RBC: 5.62 MIL/uL (ref 4.22–5.81)
RDW: 12.8 % (ref 11.5–15.5)
WBC: 9 10*3/uL (ref 4.0–10.5)
nRBC: 0 % (ref 0.0–0.2)

## 2018-08-09 LAB — BASIC METABOLIC PANEL
Anion gap: 6 (ref 5–15)
BUN: 19 mg/dL (ref 8–23)
CO2: 25 mmol/L (ref 22–32)
Calcium: 9.3 mg/dL (ref 8.9–10.3)
Chloride: 105 mmol/L (ref 98–111)
Creatinine, Ser: 1.65 mg/dL — ABNORMAL HIGH (ref 0.61–1.24)
GFR calc non Af Amer: 39 mL/min — ABNORMAL LOW (ref >60)
GFR, EST AFRICAN AMERICAN: 45 mL/min — AB (ref >60)
Glucose, Bld: 128 mg/dL — ABNORMAL HIGH (ref 70–99)
Potassium: 5.3 mmol/L — ABNORMAL HIGH (ref 3.5–5.1)
Sodium: 136 mmol/L (ref 135–145)

## 2018-08-09 LAB — I-STAT TROPONIN, ED: Troponin i, poc: 0.02 ng/mL (ref 0.00–0.08)

## 2018-08-09 LAB — MAGNESIUM: Magnesium: 2.1 mg/dL (ref 1.7–2.4)

## 2018-08-09 LAB — TSH: TSH: 2.793 u[IU]/mL (ref 0.350–4.500)

## 2018-08-09 MED ORDER — ASPIRIN EC 81 MG PO TBEC
81.0000 mg | DELAYED_RELEASE_TABLET | Freq: Every day | ORAL | Status: DC
  Administered 2018-08-10 – 2018-08-11 (×2): 81 mg via ORAL
  Filled 2018-08-09 (×2): qty 1

## 2018-08-09 MED ORDER — ONDANSETRON HCL 4 MG PO TABS: 4.0000 mg | ORAL_TABLET | Freq: Four times a day (QID) | ORAL | Status: DC | PRN

## 2018-08-09 MED ORDER — ACETAMINOPHEN 650 MG RE SUPP: 650.0000 mg | Freq: Four times a day (QID) | RECTAL | Status: DC | PRN

## 2018-08-09 MED ORDER — HEPARIN BOLUS VIA INFUSION
4000.0000 [IU] | Freq: Once | INTRAVENOUS | Status: AC
  Administered 2018-08-09: 4000 [IU] via INTRAVENOUS
  Filled 2018-08-09: qty 4000

## 2018-08-09 MED ORDER — ONDANSETRON HCL 4 MG/2ML IJ SOLN: 4.0000 mg | Freq: Four times a day (QID) | INTRAMUSCULAR | Status: DC | PRN

## 2018-08-09 MED ORDER — ACETAMINOPHEN 325 MG PO TABS: 650.0000 mg | ORAL_TABLET | Freq: Four times a day (QID) | ORAL | Status: DC | PRN

## 2018-08-09 MED ORDER — HEPARIN (PORCINE) 25000 UT/250ML-% IV SOLN
1400.0000 [IU]/h | INTRAVENOUS | Status: DC
  Administered 2018-08-09: 1400 [IU]/h via INTRAVENOUS
  Filled 2018-08-09: qty 250

## 2018-08-09 NOTE — ED Notes (Signed)
ED TO INPATIENT HANDOFF REPORT  ED Nurse Name and Phone #: Daralene Milch, RN  [phone]  S Name/Age/Gender [name] 78 y.o. male Room/Bed: 046C/046C  Code Status   Code Status: Full Code  Home/SNF/Other Home yes Patient oriented to: self, place, time and situation Is this baseline? Yes   Yes  Triage Complete: Triage complete  Chief Complaint Afib from Dr office  Triage Note Patient experienced chest pressure and dyspnea after eating breakfast today.  Symptoms improved with activity.  Symptoms lasted about 2 hours then resolved.  About 1400 the symptoms returned after the patient ate lunch.  Patient then went to his PCP to be checked.  Patient found to be in Afib with RVR.  Given 4 baby aspirin and transferred to [facility] ED via EMS.     Allergies Allergies  Allergen Reactions  . Lisinopril Other (See Comments)    Increased Serum Creatinine  . Nsaids Other (See Comments)    CONTRAINDICATION: Renal Insufficiency  . Latex Rash    Level of Care/Admitting Diagnosis ED Disposition    ED Disposition Condition [facility] Area: [place] [[id number]]  Level of Care: Cardiac Telemetry [103]  I expect the patient will be discharged within 24 hours: No (not a candidate for 5C-Observation unit)  Diagnosis: Atrial flutter (HCC) [427.32.ICD-9-CM]  Admitting Physician: Rise Patience [phone]  Attending Physician: Rise Patience [phone]  PT Class (Do Not Modify): Observation [104]  PT Acc Code (Do Not Modify): Observation [10022]       B Medical/Surgery History Past Medical History:  Diagnosis Date  . Arthritis    KNEES  . GERD (gastroesophageal reflux disease)    OCCASIONAL ONLY  . H/O hiatal hernia   . History of gout   . History of nonmelanoma skin cancer   . Prediabetes   . Prostate cancer [facility])    Past Surgical History:  Procedure Laterality Date  . CIRCUMCISION    . ROBOT ASSISTED LAPAROSCOPIC RADICAL PROSTATECTOMY N/A  07/05/2013   Procedure: ROBOTIC ASSISTED LAPAROSCOPIC NERVE SPARING RADICAL PROSTATECTOMY;  Surgeon: Irine Seal, MD;  Location: [facility];  Service: Urology;  Laterality: N/A;  . SPERMATACELE     REPAIRED  . WRIST SURGERY     RT     A IV Location/Drains/Wounds Patient Lines/Drains/Airways Status   Active Line/Drains/Airways    Name:   Placement date:   Placement time:   Site:   Days:   Peripheral IV 08/09/18 Left Antecubital   08/09/18    1833    Antecubital   less than 1          Intake/Output Last 24 hours No intake or output data in the 24 hours ending 08/09/18 2149  Labs/Imaging Results for orders placed or performed during the hospital encounter of 08/09/18 (from the past 48 hour(s))  CBC with Differential     Status: None   Collection Time: 08/09/18  7:09 PM  Result Value Ref Range   WBC 9.0 4.0 - 10.5 K/uL   RBC 5.62 4.22 - 5.81 MIL/uL   Hemoglobin 16.9 13.0 - 17.0 g/dL   HCT 50.0 39.0 - 52.0 %   MCV 89.0 80.0 - 100.0 fL   MCH 30.1 26.0 - 34.0 pg   MCHC 33.8 30.0 - 36.0 g/dL   RDW 12.8 11.5 - 15.5 %   Platelets 160 150 - 400 K/uL   nRBC 0.0 0.0 - 0.2 %   Neutrophils Relative % 70 %  Neutro Abs 6.3 1.7 - 7.7 K/uL   Lymphocytes Relative 20 %   Lymphs Abs 1.8 0.7 - 4.0 K/uL   Monocytes Relative 9 %   Monocytes Absolute 0.8 0.1 - 1.0 K/uL   Eosinophils Relative 1 %   Eosinophils Absolute 0.1 0.0 - 0.5 K/uL   Basophils Relative 0 %   Basophils Absolute 0.0 0.0 - 0.1 K/uL   Immature Granulocytes 0 %   Abs Immature Granulocytes 0.02 0.00 - 0.07 K/uL    Comment: Performed at [facility] [address] [place], [place] [address]  Basic metabolic panel     Status: Abnormal   Collection Time: 08/09/18  7:09 PM  Result Value Ref Range   Sodium 136 135 - 145 mmol/L   Potassium 5.3 (H) 3.5 - 5.1 mmol/L   Chloride 105 98 - 111 mmol/L   CO2 25 22 - 32 mmol/L   Glucose, Bld 128 (H) 70 - 99 mg/dL   BUN 19 8 - 23 mg/dL   Creatinine, Ser 1.65 (H) 0.61 - 1.24 mg/dL    Calcium 9.3 8.9 - 10.3 mg/dL   GFR calc non Af Amer 39 (L) >60 mL/min   GFR calc Af Amer 45 (L) >60 mL/min   Anion gap 6 5 - 15    Comment: Performed at [facility] [address], [place], [place] [address]  Magnesium     Status: None   Collection Time: 08/09/18  7:09 PM  Result Value Ref Range   Magnesium 2.1 1.7 - 2.4 mg/dL    Comment: Performed at [facility], [place] [address], [place], [place] [address]  I-Stat Troponin, ED (not at [facility])     Status: None   Collection Time: 08/09/18  7:19 PM  Result Value Ref Range   Troponin i, poc 0.02 0.00 - 0.08 ng/mL   Comment 3            Comment: Due to the release kinetics of cTnI, a negative result within the first hours of the onset of symptoms does not rule out myocardial infarction with certainty. If myocardial infarction is still suspected, repeat the test at appropriate intervals.    Dg Chest Portable 1 View  Result Date: 08/09/2018 CLINICAL DATA:  Chest pain palpitations EXAM: PORTABLE CHEST 1 VIEW COMPARISON:  None. FINDINGS: Borderline to mild cardiomegaly. Both lungs are clear. The visualized skeletal structures are unremarkable. IMPRESSION: No active disease.  Borderline to mild cardiomegaly. Electronically Signed   By: Donavan Foil M.D.   On: 08/09/2018 19:20    Pending Labs Unresulted Labs (From admission, onward)    Start     Ordered   08/10/18 0500  Heparin level (unfractionated)  Daily,   R     08/09/18 2047   08/10/18 0500  CBC  Daily,   R     08/09/18 2047   08/10/18 4481  Basic metabolic panel  Tomorrow morning,   R     08/09/18 2109   08/09/18 2110  Troponin I - Now Then Q6H  Now then every 6 hours,   R     08/09/18 2110   08/09/18 1908  TSH  ONCE - STAT,   STAT     08/09/18 1907          Vitals/Pain Today's Vitals   08/09/18 2000 08/09/18 2030 08/09/18 2100 08/09/18 2130  BP: 107/81 123/81 111/85 112/75  Pulse: 66 82 68 67  Resp: (!) 22 (!) 21 (!)  23 15  Temp:      TempSrc:       SpO2: 95% 97% 98% 96%  Weight:      Height:      PainSc:        Isolation Precautions No active isolations  Medications Medications  heparin bolus via infusion 4,000 Units (has no administration in time range)  heparin ADULT infusion 100 units/mL (25000 units/272mL sodium chloride 0.45%) (has no administration in time range)  aspirin EC tablet 81 mg (has no administration in time range)  acetaminophen (TYLENOL) tablet 650 mg (has no administration in time range)    Or  acetaminophen (TYLENOL) suppository 650 mg (has no administration in time range)  ondansetron (ZOFRAN) tablet 4 mg (has no administration in time range)    Or  ondansetron (ZOFRAN) injection 4 mg (has no administration in time range)    Mobility walks     Focused Assessments Cardiac Assessment Handoff:  Cardiac Rhythm: Atrial flutter No results found for: CKTOTAL, CKMB, CKMBINDEX, TROPONINI No results found for: DDIMER Does the Patient currently have chest pain? No      R Recommendations: See Admitting Provider Note  Report given to: Barbie Banner, RN  Additional Notes: Pt alert and oriented x's 4.

## 2018-08-09 NOTE — Progress Notes (Signed)
ANTICOAGULATION CONSULT NOTE - Initial Consult  Pharmacy Consult for heparin Indication: atrial fibrillation  Allergies  Allergen Reactions  . Lisinopril Other (See Comments)    Increased Serum Creatinine  . Nsaids Other (See Comments)    CONTRAINDICATION: Renal Insufficiency  . Latex Rash    Patient Measurements: Height: 5\' 10"  (177.8 cm) Weight: 236 lb (107 kg) IBW/kg (Calculated) : 73 Heparin Dosing Weight: 96 kg  Vital Signs: Temp: 98.5 F (36.9 C) (03/10 1831) Temp Source: Oral (03/10 1831) BP: 127/81 (03/10 1831) Pulse Rate: 112 (03/10 1831)  Labs: Recent Labs    08/09/18 1909  HGB 16.9  HCT 50.0  PLT 160  CREATININE 1.65*    Estimated Creatinine Clearance: 44.5 mL/min (A) (by C-G formula based on SCr of 1.65 mg/dL (H)).   Medical History: Past Medical History:  Diagnosis Date  . Arthritis    KNEES  . GERD (gastroesophageal reflux disease)    OCCASIONAL ONLY  . H/O hiatal hernia   . History of gout   . History of nonmelanoma skin cancer   . Prediabetes   . Prostate cancer     Assessment: 78 yo M presents with Afib. No anticoag PTA. Pharmacy consulted to start heparin. CBC stable.  Goal of Therapy:  Heparin level 0.3-0.7 units/ml Monitor platelets by anticoagulation protocol: Yes   Plan:  Give heparin 4,000 unit bolus  Start heparin gtt at 1,400 units/hr Monitor daily heparin level, CBC, s/s of bleed   Lyal Husted J 08/09/2018,8:43 PM

## 2018-08-09 NOTE — H&P (Addendum)
History and Physical    Robert Summers JGO:[id number] DOB: 09/02/1940 DOA: 08/09/2018  PCP: Manfred Shirts, PA  Patient coming from: Home.  Chief Complaint: Chest pain.  HPI: Robert Summers is a 78 y.o. male with history of prostate cancer in remission was referred to the ER by patient's primary care physician after patient was found to be in atrial flutter with RVR.  Patient started experiencing chest pressure this morning after he woke up.  He also found some difficulty taking deep breath with some discomfort.  It lasted for almost 2 to 3 hours improved after breakfast.  Patient went shopping for his errands and later in the afternoon around 1 PM started experiencing similar symptoms again at this point patient decided to go to his PCP.  Was found to be in atrial flutter with RVR and was transferred to [facility].  Patient has some chronic edema of the left lower extremity.  ED Course: In the ER patient's atrial flutter with RVR improved in rate without intervention.  Cardiac markers negative chest x-ray unremarkable.  Patient was still in [place].  On-call cardiologist was consulted by ER physician who at this time recommended admission for observation given the chest pressure.  Patient also has family history of CAD.  Review of Systems: As per HPI, rest all negative.   Past Medical History:  Diagnosis Date  . Arthritis    KNEES  . GERD (gastroesophageal reflux disease)    OCCASIONAL ONLY  . H/O hiatal hernia   . History of gout   . History of nonmelanoma skin cancer   . Prediabetes   . Prostate cancer [facility])     Past Surgical History:  Procedure Laterality Date  . CIRCUMCISION    . ROBOT ASSISTED LAPAROSCOPIC RADICAL PROSTATECTOMY N/A 07/05/2013   Procedure: ROBOTIC ASSISTED LAPAROSCOPIC NERVE SPARING RADICAL PROSTATECTOMY;  Surgeon: Irine Seal, MD;  Location: [facility];  Service: Urology;  Laterality: N/A;  . SPERMATACELE     REPAIRED  . WRIST SURGERY     RT     reports  that he has quit smoking. He quit smokeless tobacco use about 56 years ago. He reports current alcohol use. He reports that he does not use drugs.  Allergies  Allergen Reactions  . Lisinopril Other (See Comments)    Increased Serum Creatinine  . Nsaids Other (See Comments)    CONTRAINDICATION: Renal Insufficiency  . Latex Rash    Family history - FATHER AND BROTHER HAD CAD at age more than 76.  Prior to Admission medications   Medication Sig Start Date End Date Taking? Authorizing Provider  aspirin EC 81 MG tablet Take 81 mg by mouth daily.   Yes [provider]  cholecalciferol (VITAMIN D) 25 MCG (1000 UT) tablet Take 1,000 Units by mouth daily.   Yes [provider]  MAGNESIUM CARBONATE PO Take 1 capsule by mouth daily.   Yes [provider]  Multiple Vitamin (MULTIVITAMIN WITH MINERALS) TABS tablet Take 1 tablet by mouth daily.   Yes [provider]  Omega-3 Fatty Acids (FISH OIL) 1000 MG CAPS Take 1 capsule by mouth daily.   Yes [provider]  furosemide (LASIX) 20 MG tablet Take 2 tablets (40 mg total) by mouth daily. Patient not taking: Reported on 08/09/2018 06/19/17   Jillyn Ledger, PA-C    Physical Exam: Vitals:   08/09/18 1831 08/09/18 1842 08/09/18 2100  BP: 127/81  111/85  Pulse: (!) 112  68  Resp:  16  (!) 23  Temp: 98.5 F (36.9 C)    TempSrc: Oral    SpO2: 97%  98%  Weight:  107 kg   Height:  5\' 10"  (1.778 m)       Constitutional: Moderately built and nourished. Vitals:   08/09/18 1831 08/09/18 1842 08/09/18 2100  BP: 127/81  111/85  Pulse: (!) 112  68  Resp: 16  (!) 23  Temp: 98.5 F (36.9 C)    TempSrc: Oral    SpO2: 97%  98%  Weight:  107 kg   Height:  5\' 10"  (1.778 m)    Eyes: Anicteric no pallor. ENMT: No discharge from the ears eyes nose and mouth. Neck: No mass felt.  No neck rigidity.  No JVD appreciated. Respiratory: No rhonchi or crepitations. Cardiovascular: S1-S2 heard. Abdomen: Soft  nontender bowel sounds present. Musculoskeletal: Mild swelling of the left lower extremity. Skin: No rash. Neurologic: Alert awake oriented to time place and person.  Moves all extremities. Psychiatric: Appears normal per normal affect.   Labs on Admission: I have personally reviewed following labs and imaging studies  CBC: Recent Labs  Lab 08/09/18 1909  WBC 9.0  NEUTROABS 6.3  HGB 16.9  HCT 50.0  MCV 89.0  PLT 706   Basic Metabolic Panel: Recent Labs  Lab 08/09/18 1909  NA 136  K 5.3*  CL 105  CO2 25  GLUCOSE 128*  BUN 19  CREATININE 1.65*  CALCIUM 9.3  MG 2.1   GFR: Estimated Creatinine Clearance: 44.5 mL/min (A) (by C-G formula based on SCr of 1.65 mg/dL (H)). Liver Function Tests: No results for input(s): AST, ALT, ALKPHOS, BILITOT, PROT, ALBUMIN in the last 168 hours. No results for input(s): LIPASE, AMYLASE in the last 168 hours. No results for input(s): AMMONIA in the last 168 hours. Coagulation Profile: No results for input(s): INR, PROTIME in the last 168 hours. Cardiac Enzymes: No results for input(s): CKTOTAL, CKMB, CKMBINDEX, TROPONINI in the last 168 hours. BNP (last 3 results) No results for input(s): PROBNP in the last 8760 hours. HbA1C: No results for input(s): HGBA1C in the last 72 hours. CBG: No results for input(s): GLUCAP in the last 168 hours. Lipid Profile: No results for input(s): CHOL, HDL, LDLCALC, TRIG, CHOLHDL, LDLDIRECT in the last 72 hours. Thyroid Function Tests: No results for input(s): TSH, T4TOTAL, FREET4, T3FREE, THYROIDAB in the last 72 hours. Anemia Panel: No results for input(s): VITAMINB12, FOLATE, FERRITIN, TIBC, IRON, RETICCTPCT in the last 72 hours. Urine analysis: No results found for: COLORURINE, APPEARANCEUR, LABSPEC, PHURINE, GLUCOSEU, HGBUR, BILIRUBINUR, KETONESUR, PROTEINUR, UROBILINOGEN, NITRITE, LEUKOCYTESUR Sepsis Labs: @LABRCNTIP (procalcitonin:4,lacticidven:4) )No results found for this or any previous  visit (from the past 240 hour(s)).   Radiological Exams on Admission: Dg Chest Portable 1 View  Result Date: 08/09/2018 CLINICAL DATA:  Chest pain palpitations EXAM: PORTABLE CHEST 1 VIEW COMPARISON:  None. FINDINGS: Borderline to mild cardiomegaly. Both lungs are clear. The visualized skeletal structures are unremarkable. IMPRESSION: No active disease.  Borderline to mild cardiomegaly. Electronically Signed   By: Donavan Foil M.D.   On: 08/09/2018 19:20    EKG: Independently reviewed.  A flutter with RVR.  Assessment/Plan Active Problems:   Atrial flutter with rapid ventricular response (HCC)   Chest pain    1. A flutter with RVR -rate improved without intervention.  Check cardiac markers TSH 2D echo.  Chads 2 vasc score is 2 which patient has been started for now on heparin.  Cardiology notified.  Since patient  has left lower extremity edema will check Dopplers to rule out DVT and also check d-dimer. 2. Chest pressure -we will cycle cardiac markers check 2D echo aspirin.  Patient is chest pain-free at this time.  Is on heparin for atrial flutter.  Cardiology notified. 3. Chronic kidney disease stage II appears to be chronic.  Follow metabolic panel. 4. History of prostate cancer in remission. 5. Mild hyperkalemia.  Repeat metabolic panel.   DVT prophylaxis: Heparin. Code Status: Full code. Family Communication: Patient's wife. Disposition Plan: Home. Consults called: Cardiology. Admission status: Observation.   Rise Patience MD [facility] Pager [phone].  If 7PM-7AM, please contact night-coverage [email or web address] Password [email or web address]  08/09/2018, 9:10 PM

## 2018-08-09 NOTE — ED Provider Notes (Addendum)
[facility] EMERGENCY DEPARTMENT Provider Note   CSN: [id number] Arrival date & time: 08/09/18  1820    History   Chief Complaint Chief Complaint  Patient presents with  . Atrial Fibrillation    HPI Robert Summers is a 78 y.o. male.     The history is provided by the patient.  Chest Pain  Pain location:  Substernal area and L chest Pain quality: pressure   Pain radiates to:  Does not radiate Pain severity:  Mild Onset quality:  Sudden Timing:  Intermittent Progression:  Waxing and waning Chronicity:  New Context: at rest   Relieved by:  Nothing Worsened by:  Nothing Associated symptoms: shortness of breath   Associated symptoms: no abdominal pain, no anxiety, no back pain, no cough, no dizziness, no fever, no lower extremity edema, no nausea, no palpitations and no vomiting   Risk factors: male sex   Risk factors: no coronary artery disease, no diabetes mellitus, no high cholesterol, no hypertension, no immobilization, no prior DVT/PE and no smoking     Past Medical History:  Diagnosis Date  . Arthritis    KNEES  . GERD (gastroesophageal reflux disease)    OCCASIONAL ONLY  . H/O hiatal hernia   . History of gout   . History of nonmelanoma skin cancer   . Prediabetes   . Prostate cancer     Patient Active Problem List   Diagnosis Date Noted  . Malignant neoplasm of prostate ([place]) 07/05/2013    Past Surgical History:  Procedure Laterality Date  . CIRCUMCISION    . ROBOT ASSISTED LAPAROSCOPIC RADICAL PROSTATECTOMY N/A 07/05/2013   Procedure: ROBOTIC ASSISTED LAPAROSCOPIC NERVE SPARING RADICAL PROSTATECTOMY;  Surgeon: Irine Seal, MD;  Location: [facility];  Service: Urology;  Laterality: N/A;  . SPERMATACELE     REPAIRED  . WRIST SURGERY     RT        Home Medications    Prior to Admission medications   Medication Sig Start Date End Date Taking? Authorizing Provider  ciprofloxacin (CIPRO) 500 MG tablet Take 1 tablet (500 mg total)  by mouth 2 (two) times daily. Start day prior to office visit for foley removal 07/05/13   Debbrah Alar, PA-C  furosemide (LASIX) 20 MG tablet Take 2 tablets (40 mg total) by mouth daily. 06/19/17   Maczis, Barth Kirks, PA-C  HYDROcodone-acetaminophen (NORCO) 5-325 MG per tablet Take 1-2 tablets by mouth every 6 (six) hours as needed. 07/05/13   Debbrah Alar, PA-C    Family History No family history on file.  Social History Social History   Tobacco Use  . Smoking status: Former Research scientist (life sciences)  . Smokeless tobacco: Former Systems developer    Quit date: 06/28/1962  Substance Use Topics  . Alcohol use: Yes    Comment: RARE  . Drug use: No     Allergies   Patient has no known allergies.   Review of Systems Review of Systems  Constitutional: Negative for chills and fever.  HENT: Negative for ear pain and sore throat.   Eyes: Negative for pain and visual disturbance.  Respiratory: Positive for chest tightness and shortness of breath. Negative for cough and wheezing.   Cardiovascular: Positive for chest pain. Negative for palpitations and leg swelling.  Gastrointestinal: Negative for abdominal pain, nausea and vomiting.  Genitourinary: Negative for dysuria and hematuria.  Musculoskeletal: Negative for arthralgias and back pain.  Skin: Negative for color change and rash.  Neurological: Negative for dizziness, seizures and syncope.  All other systems reviewed and are negative.    Physical Exam Updated Vital Signs  ED Triage Vitals  Enc Vitals Group     BP 08/09/18 1831 127/81     Pulse Rate 08/09/18 1831 (!) 112     Resp 08/09/18 1831 16     Temp 08/09/18 1831 98.5 F (36.9 C)     Temp Source 08/09/18 1831 Oral     SpO2 08/09/18 1831 97 %     Weight 08/09/18 1842 236 lb (107 kg)     Height 08/09/18 1842 5\' 10"  (1.778 m)     Head Circumference --      Peak Flow --      Pain Score 08/09/18 1831 0     Pain Loc --      Pain Edu? --      Excl. in [place]? --     Physical Exam Vitals signs and  nursing note reviewed.  Constitutional:      General: He is not in acute distress.    Appearance: He is well-developed. He is not ill-appearing.  HENT:     Head: Normocephalic and atraumatic.     Nose: Nose normal.     Mouth/Throat:     Mouth: Mucous membranes are moist.  Eyes:     Extraocular Movements: Extraocular movements intact.     Conjunctiva/sclera: Conjunctivae normal.     Pupils: Pupils are equal, round, and reactive to light.  Neck:     Musculoskeletal: Normal range of motion and neck supple.  Cardiovascular:     Rate and Rhythm: Tachycardia present.     Pulses: Normal pulses.     Heart sounds: Normal heart sounds. No murmur.  Pulmonary:     Effort: Pulmonary effort is normal. No respiratory distress.     Breath sounds: Normal breath sounds.  Abdominal:     General: There is no distension.     Palpations: Abdomen is soft.     Tenderness: There is no abdominal tenderness.  Musculoskeletal: Normal range of motion.        General: No swelling or tenderness.     Right lower leg: No edema.     Left lower leg: No edema.  Skin:    General: Skin is warm and dry.     Capillary Refill: Capillary refill takes less than 2 seconds.  Neurological:     General: No focal deficit present.     Mental Status: He is alert and oriented to person, place, and time.     Cranial Nerves: No cranial nerve deficit.     Sensory: No sensory deficit.     Motor: No weakness.     Coordination: Coordination normal.  Psychiatric:        Mood and Affect: Mood normal.      ED Treatments / Results  Labs (all labs ordered are listed, but only abnormal results are displayed) Labs Reviewed  BASIC METABOLIC PANEL - Abnormal; Notable for the following components:      Result Value   Potassium 5.3 (*)    Glucose, Bld 128 (*)    Creatinine, Ser 1.65 (*)    GFR calc non Af Amer 39 (*)    GFR calc Af Amer 45 (*)    All other components within normal limits  CBC WITH DIFFERENTIAL/PLATELET    MAGNESIUM  TSH  I-STAT TROPONIN, ED    EKG EKG Interpretation  Date/Time:  Tuesday August 09 2018 18:30:33 EDT Ventricular Rate:  115 PR  Interval:    QRS Duration: 103 QT Interval:  385 QTC Calculation: 578 R Axis:   109 Text Interpretation:  Atrial flutter Right axis deviation Abnormal R-wave progression, late transition ST elevation, consider inferior injury Prolonged QT interval Confirmed by Lennice Sites [phone]) on 08/09/2018 6:44:37 PM   Radiology Dg Chest Portable 1 View  Result Date: 08/09/2018 CLINICAL DATA:  Chest pain palpitations EXAM: PORTABLE CHEST 1 VIEW COMPARISON:  None. FINDINGS: Borderline to mild cardiomegaly. Both lungs are clear. The visualized skeletal structures are unremarkable. IMPRESSION: No active disease.  Borderline to mild cardiomegaly. Electronically Signed   By: Donavan Foil M.D.   On: 08/09/2018 19:20    Procedures .Critical Care Performed by: Lennice Sites, DO Authorized by: Lennice Sites, DO   Critical care provider statement:    Critical care time (minutes):  35   Critical care was necessary to treat or prevent imminent or life-threatening deterioration of the following conditions:  Cardiac failure   Critical care was time spent personally by me on the following activities:  Blood draw for specimens, development of treatment plan with patient or surrogate, discussions with consultants, evaluation of patient's response to treatment, interpretation of cardiac output measurements, obtaining history from patient or surrogate, ordering and performing treatments and interventions, ordering and review of laboratory studies, ordering and review of radiographic studies, pulse oximetry, re-evaluation of patient's condition, review of old charts and discussions with primary provider   (including critical care time)  Medications Ordered in ED Medications - No data to display   Initial Impression / Assessment and Plan / ED Course  I have reviewed  the triage vital signs and the nursing notes.  Pertinent labs & imaging results that were available during my care of the patient were reviewed by me and considered in my medical decision making (see chart for details).     Robert Summers is a 78 year old male with history of reflux who presents to the ED with chest pain and concern for new onset atrial flutter/atrial fibrillation from primary care doctor's office.  Patient with heart rate in the 130s upon arrival however appeared to improve throughout my evaluation.  Heart rate appears to be more consistently in the 90s and low 100s.  EKG shows atrial flutter.  Patient states that he has not really noticed palpitations.  He states that he had an episode of left-sided chest pain this morning shortly after eating.  It went away on its own and then he had another episode this afternoon.  Has family history of cardiac disease.  Otherwise does not have much cardiac risk factors except for his age.  Patient was found to have atrial flutter in primary care doctor's office and was sent for evaluation.  Has no history of this.  Had labs ordered including troponin, TSH, magnesium.  Chest x-ray ordered.  Contacted cardiology on the phone and they recommend admission for ACS rule out and telemetry.  They recommend echocardiogram and will evaluate the patient in the morning.  They would hold off on any rate control medication at this time as his heart rate appears to be within normal limits.  Prior EKG showed bradycardia in the 50s and prefer to evaluate over a longer stretch of time to see what his true heart rate is.  They recommend calling back if he does become tachycardic more persistently.  They will need to discuss the need for long-term anticoagulation.  Patient is not hypoxic, breathing well on room air, no DVT or PE  risk factors.  Patient with no obvious pneumonia, pneumothorax, pleural effusion on chest x-ray.  Patient with overall no significant anemia,  electrolyte abnormality.  Kidney function mildly above baseline.  Troponin within normal limits.  Patient currently chest pain-free.  However, after talking with hospitalist will start IV heparin for possible ACS/arrhythmia.  Admitted to medicine service for further ACS rule out and for telemetry.  Stable throughout my care.  This chart was dictated using voice recognition software.  Despite best efforts to proofread,  errors can occur which can change the documentation meaning.    Final Clinical Impressions(s) / ED Diagnoses   Final diagnoses:  Atrial flutter with rapid ventricular response [facility])  Chest pain, unspecified type    ED Discharge Orders    None       Lennice Sites, DO 08/09/18 1957    Lennice Sites, DO 08/09/18 2041

## 2018-08-09 NOTE — ED Triage Notes (Signed)
Patient experienced chest pressure and dyspnea after eating breakfast today.  Symptoms improved with activity.  Symptoms lasted about 2 hours then resolved.  About 1400 the symptoms returned after the patient ate lunch.  Patient then went to his PCP to be checked.  Patient found to be in Afib with RVR.  Given 4 baby aspirin and transferred to [facility] ED via EMS.

## 2018-08-10 ENCOUNTER — Observation Stay (HOSPITAL_COMMUNITY): Payer: Medicare Other

## 2018-08-10 ENCOUNTER — Encounter (HOSPITAL_COMMUNITY): Payer: Self-pay | Admitting: Cardiology

## 2018-08-10 DIAGNOSIS — Z79899 Other long term (current) drug therapy: Secondary | ICD-10-CM | POA: Diagnosis not present

## 2018-08-10 DIAGNOSIS — Z7982 Long term (current) use of aspirin: Secondary | ICD-10-CM | POA: Diagnosis not present

## 2018-08-10 DIAGNOSIS — M109 Gout, unspecified: Secondary | ICD-10-CM | POA: Diagnosis not present

## 2018-08-10 DIAGNOSIS — I4892 Unspecified atrial flutter: Secondary | ICD-10-CM | POA: Diagnosis present

## 2018-08-10 DIAGNOSIS — Z8546 Personal history of malignant neoplasm of prostate: Secondary | ICD-10-CM | POA: Diagnosis not present

## 2018-08-10 DIAGNOSIS — Z8249 Family history of ischemic heart disease and other diseases of the circulatory system: Secondary | ICD-10-CM | POA: Diagnosis not present

## 2018-08-10 DIAGNOSIS — Z87891 Personal history of nicotine dependence: Secondary | ICD-10-CM | POA: Diagnosis not present

## 2018-08-10 DIAGNOSIS — E1122 Type 2 diabetes mellitus with diabetic chronic kidney disease: Secondary | ICD-10-CM | POA: Diagnosis not present

## 2018-08-10 DIAGNOSIS — R609 Edema, unspecified: Secondary | ICD-10-CM

## 2018-08-10 DIAGNOSIS — N179 Acute kidney failure, unspecified: Secondary | ICD-10-CM

## 2018-08-10 DIAGNOSIS — Z85828 Personal history of other malignant neoplasm of skin: Secondary | ICD-10-CM | POA: Diagnosis not present

## 2018-08-10 DIAGNOSIS — K219 Gastro-esophageal reflux disease without esophagitis: Secondary | ICD-10-CM | POA: Diagnosis not present

## 2018-08-10 DIAGNOSIS — E663 Overweight: Secondary | ICD-10-CM | POA: Diagnosis not present

## 2018-08-10 DIAGNOSIS — Z6833 Body mass index (BMI) 33.0-33.9, adult: Secondary | ICD-10-CM | POA: Diagnosis not present

## 2018-08-10 DIAGNOSIS — E875 Hyperkalemia: Secondary | ICD-10-CM | POA: Diagnosis not present

## 2018-08-10 DIAGNOSIS — N183 Chronic kidney disease, stage 3 (moderate): Secondary | ICD-10-CM | POA: Diagnosis not present

## 2018-08-10 DIAGNOSIS — I4891 Unspecified atrial fibrillation: Secondary | ICD-10-CM | POA: Diagnosis not present

## 2018-08-10 HISTORY — DX: Unspecified atrial flutter: I48.92

## 2018-08-10 HISTORY — DX: Acute kidney failure, unspecified: N17.9

## 2018-08-10 LAB — BASIC METABOLIC PANEL
Anion gap: 6 (ref 5–15)
BUN: 18 mg/dL (ref 8–23)
CO2: 26 mmol/L (ref 22–32)
Calcium: 9 mg/dL (ref 8.9–10.3)
Chloride: 107 mmol/L (ref 98–111)
Creatinine, Ser: 1.47 mg/dL — ABNORMAL HIGH (ref 0.61–1.24)
GFR calc Af Amer: 52 mL/min — ABNORMAL LOW (ref >60)
GFR calc non Af Amer: 45 mL/min — ABNORMAL LOW (ref >60)
GLUCOSE: 99 mg/dL (ref 70–99)
Potassium: 4.1 mmol/L (ref 3.5–5.1)
Sodium: 139 mmol/L (ref 135–145)

## 2018-08-10 LAB — CBC
HCT: 48 % (ref 39.0–52.0)
Hemoglobin: 16.8 g/dL (ref 13.0–17.0)
MCH: 30.7 pg (ref 26.0–34.0)
MCHC: 35 g/dL (ref 30.0–36.0)
MCV: 87.8 fL (ref 80.0–100.0)
PLATELETS: 157 10*3/uL (ref 150–400)
RBC: 5.47 MIL/uL (ref 4.22–5.81)
RDW: 12.9 % (ref 11.5–15.5)
WBC: 7.2 10*3/uL (ref 4.0–10.5)
nRBC: 0 % (ref 0.0–0.2)

## 2018-08-10 LAB — URINALYSIS, ROUTINE W REFLEX MICROSCOPIC
Bilirubin Urine: NEGATIVE
Glucose, UA: NEGATIVE mg/dL
Hgb urine dipstick: NEGATIVE
Ketones, ur: 20 mg/dL — AB
Leukocytes,Ua: NEGATIVE
Nitrite: NEGATIVE
Protein, ur: NEGATIVE mg/dL
Specific Gravity, Urine: 1.011 (ref 1.005–1.030)
pH: 6 (ref 5.0–8.0)

## 2018-08-10 LAB — D-DIMER, QUANTITATIVE: D-Dimer, Quant: 0.46 ug/mL-FEU (ref 0.00–0.50)

## 2018-08-10 LAB — CREATININE, URINE, RANDOM: Creatinine, Urine: 92.37 mg/dL

## 2018-08-10 LAB — TROPONIN I
Troponin I: <0.03 ng/mL (ref <0.03)
Troponin I: <0.03 ng/mL (ref <0.03)
Troponin I: <0.03 ng/mL (ref <0.03)

## 2018-08-10 LAB — ECHOCARDIOGRAM COMPLETE
Height: 70 in
Weight: 3702.4 oz

## 2018-08-10 LAB — HEPARIN LEVEL (UNFRACTIONATED)
HEPARIN UNFRACTIONATED: 0.55 [IU]/mL (ref 0.30–0.70)
Heparin Unfractionated: 0.58 IU/mL (ref 0.30–0.70)

## 2018-08-10 LAB — SODIUM, URINE, RANDOM: Sodium, Ur: 83 mmol/L

## 2018-08-10 MED ORDER — APIXABAN 5 MG PO TABS
5.0000 mg | ORAL_TABLET | Freq: Two times a day (BID) | ORAL | Status: DC
  Administered 2018-08-10 – 2018-08-11 (×3): 5 mg via ORAL
  Filled 2018-08-10 (×3): qty 1

## 2018-08-10 MED ORDER — PERFLUTREN LIPID MICROSPHERE
1.0000 mL | INTRAVENOUS | Status: AC | PRN
  Administered 2018-08-10: 2 mL via INTRAVENOUS
  Filled 2018-08-10: qty 10

## 2018-08-10 MED ORDER — PERFLUTREN LIPID MICROSPHERE
INTRAVENOUS | Status: AC
  Administered 2018-08-10: 2 mL via INTRAVENOUS
  Filled 2018-08-10: qty 10

## 2018-08-10 NOTE — Progress Notes (Signed)
ANTICOAGULATION CONSULT NOTE - Follow Up Consult  Pharmacy Consult for heparin Indication: atrial fibrillation  Labs: Recent Labs    08/09/18 1909 08/09/18 2322 08/10/18 0402  HGB 16.9  --  16.8  HCT 50.0  --  48.0  PLT 160  --  157  HEPARINUNFRC  --   --  0.58  CREATININE 1.65*  --   --   TROPONINI  --  <0.03  --     Assessment/Plan:  78yo male therapeutic on heparin with initial dosing for Afib. Will continue gtt at current rate and confirm stable with additional level.   Wynona Neat, PharmD, BCPS  08/10/2018,5:17 AM

## 2018-08-10 NOTE — H&P (View-Only) (Signed)
Cardiology Consultation:   Patient ID: ODAI WIMMER MRN: [id number]; DOB: 24-Oct-1940  Admit date: 08/09/2018 Date of Consult: 08/10/2018  Primary Care Provider: Manfred Shirts, PA Primary Cardiologist: Gita Dilger [place], MD - New Primary Electrophysiologist:  None   Patient Profile:   Robert Summers is a 78 y.o. male with a hx of prostate cancer in remission, prediabetes, GERD, arthritis who is being seen today for the evaluation of atrial flutter at the request of Dr. Maylene Roes.  History of Present Illness:   Mr. Robert Summers was in his usual state of health until yesterday morning when he awoke early feeling restless.  After a while in bed with no improvement he got up to eat breakfast.  After he ate breakfast and was sitting at the table he began to feel difficulty breathing and mild chest tightness.  This lasted for couple of hours and then resolved.  He went out to run errands and felt okay.  After eating a late lunch around 2 he again developed shortness of breath and mild chest pressure.  This lasted for couple of hours and then around 4 he mentioned it to his wife who called their PCP.  He was able to be seen that afternoon.  He was found to be in atrial flutter with RVR and was referred to the ED.  Once in the ED he continued to be in atrial flutter but his rate was well controlled without any intervention.   The patient reports that he has had no prior cardiac issues.  He had a stress test once in the late 1990s which was normal.  He used to smoke for short time but quit at age 70.  He has an occasional glass of wine.  He has been told that he has prediabetes but does not take any medications or do any interventions.  He denies high blood pressure, stroke.  He says that he has always been very healthy.  He denies any recent illness.  His father died of a massive MI at age 8.  He has a brother who died of unknown cause, presumably an MI at age 53. 69 of his siblings have AAA, his sister had repair.    Past Medical History:  Diagnosis Date   Arthritis    KNEES   GERD (gastroesophageal reflux disease)    OCCASIONAL ONLY   H/O hiatal hernia    History of gout    History of nonmelanoma skin cancer    Prediabetes    Prostate cancer ([place])     Past Surgical History:  Procedure Laterality Date   CIRCUMCISION     ROBOT ASSISTED LAPAROSCOPIC RADICAL PROSTATECTOMY N/A 07/05/2013   Procedure: ROBOTIC ASSISTED LAPAROSCOPIC NERVE SPARING RADICAL PROSTATECTOMY;  Surgeon: Irine Seal, MD;  Location: [facility];  Service: Urology;  Laterality: N/A;   SPERMATACELE     REPAIRED   WRIST SURGERY     RT     Home Medications:  Prior to Admission medications   Medication Sig Start Date End Date Taking? Authorizing Provider  aspirin EC 81 MG tablet Take 81 mg by mouth daily.   Yes [provider]  cholecalciferol (VITAMIN D) 25 MCG (1000 UT) tablet Take 1,000 Units by mouth daily.   Yes [provider]  MAGNESIUM CARBONATE PO Take 1 capsule by mouth daily.   Yes [provider]  Multiple Vitamin (MULTIVITAMIN WITH MINERALS) TABS tablet Take 1 tablet by mouth daily.   Yes [provider]  Omega-3 Fatty Acids (  FISH OIL) 1000 MG CAPS Take 1 capsule by mouth daily.   Yes [provider]  furosemide (LASIX) 20 MG tablet Take 2 tablets (40 mg total) by mouth daily. Patient not taking: Reported on 08/09/2018 06/19/17   Jillyn Ledger, PA-C    Inpatient Medications: Scheduled Meds:  aspirin EC  81 mg Oral Daily   Continuous Infusions:  heparin 1,400 Units/hr (08/10/18 0353)   PRN Meds: acetaminophen **OR** acetaminophen, ondansetron **OR** ondansetron (ZOFRAN) IV  Allergies:    Allergies  Allergen Reactions   Lisinopril Other (See Comments)    Increased Serum Creatinine   Nsaids Other (See Comments)    CONTRAINDICATION: Renal Insufficiency   Latex Rash    Social History:   Social History   Socioeconomic History   Marital  status: Married    Spouse name: Not on file   Number of children: Not on file   Years of education: Not on file   Highest education level: Not on file  Occupational History   Not on file  Social Needs   Financial resource strain: Not on file   Food insecurity:    Worry: Not on file    Inability: Not on file   Transportation needs:    Medical: Not on file    Non-medical: Not on file  Tobacco Use   Smoking status: Former Smoker   Smokeless tobacco: Former Systems developer    Quit date: 06/28/1962  Substance and Sexual Activity   Alcohol use: Yes    Comment: RARE   Drug use: No   Sexual activity: Not on file  Lifestyle   Physical activity:    Days per week: Not on file    Minutes per session: Not on file   Stress: Not on file  Relationships   Social connections:    Talks on phone: Not on file    Gets together: Not on file    Attends religious service: Not on file    Active member of club or organization: Not on file    Attends meetings of clubs or organizations: Not on file    Relationship status: Not on file   Intimate partner violence:    Fear of current or ex partner: Not on file    Emotionally abused: Not on file    Physically abused: Not on file    Forced sexual activity: Not on file  Other Topics Concern   Not on file  Social History Narrative   Not on file    Family History:    Family History  Problem Relation Age of Onset   CAD Father    CAD Brother      ROS:  Please see the history of present illness.   All other ROS reviewed and negative.     Physical Exam/Data:   Vitals:   08/09/18 2130 08/09/18 2241 08/09/18 2249 08/10/18 0542  BP: 112/75  133/80 114/78  Pulse: 67  76 78  Resp: 15  20 20   Temp:   97.9 F (36.6 C) 97.9 F (36.6 C)  TempSrc:   Oral Oral  SpO2: 96%  97% 94%  Weight:  106.3 kg  105 kg  Height:  5\' 10"  (1.778 m)      Intake/Output Summary (Last 24 hours) at 08/10/2018 1021 Last data filed at 08/10/2018 0353 Gross  per 24 hour  Intake 119.32 ml  Output --  Net 119.32 ml   Last 3 Weights 08/10/2018 08/09/2018 08/09/2018  Weight (lbs) 231 lb 6.4 oz  234 lb 6.4 oz 236 lb  Weight (kg) 104.962 kg 106.323 kg 107.049 kg     Body mass index is 33.2 kg/m.  General:  Well nourished, well developed, in no acute distress HEENT: normal Lymph: no adenopathy Neck: no JVD Endocrine:  No thryomegaly Vascular: No carotid bruits; FA pulses 2+ bilaterally without bruits  Cardiac:  normal S1, S2; Slightly irregular rhythm; no murmur  Lungs:  clear to auscultation bilaterally, no wheezing, rhonchi or rales  Abd: soft, nontender, no hepatomegaly  Ext: no edema Musculoskeletal:  No deformities, BUE and BLE strength normal and equal Skin: warm and dry  Neuro:  CNs 2-12 intact, no focal abnormalities noted Psych:  Normal affect   EKG:  The EKG was personally reviewed and demonstrates: Atrial flutter, 115 bpm, Right axis deviation, Abnormal R-wave progression, late transition Telemetry:  Telemetry was personally reviewed and demonstrates: Atrial flutter in the 70s with 3:1 and 4:1 variable conduction  Relevant CV Studies:  Echo is pending  Laboratory Data:  Chemistry Recent Labs  Lab 08/09/18 1909 08/10/18 0402  NA 136 139  K 5.3* 4.1  CL 105 107  CO2 25 26  GLUCOSE 128* 99  BUN 19 18  CREATININE 1.65* 1.47*  CALCIUM 9.3 9.0  GFRNONAA 39* 45*  GFRAA 45* 52*  ANIONGAP 6 6    No results for input(s): PROT, ALBUMIN, AST, ALT, ALKPHOS, BILITOT in the last 168 hours. Hematology Recent Labs  Lab 08/09/18 1909 08/10/18 0402  WBC 9.0 7.2  RBC 5.62 5.47  HGB 16.9 16.8  HCT 50.0 48.0  MCV 89.0 87.8  MCH 30.1 30.7  MCHC 33.8 35.0  RDW 12.8 12.9  PLT 160 157   Cardiac Enzymes Recent Labs  Lab 08/09/18 2322 08/10/18 0402  TROPONINI <0.03 <0.03    Recent Labs  Lab 08/09/18 1919  TROPIPOC 0.02    BNPNo results for input(s): BNP, PROBNP in the last 168 hours.  DDimer  Recent Labs  Lab  08/10/18 0931  DDIMER 0.46    Radiology/Studies:  Dg Chest Portable 1 View  Result Date: 08/09/2018 CLINICAL DATA:  Chest pain palpitations EXAM: PORTABLE CHEST 1 VIEW COMPARISON:  None. FINDINGS: Borderline to mild cardiomegaly. Both lungs are clear. The visualized skeletal structures are unremarkable. IMPRESSION: No active disease.  Borderline to mild cardiomegaly. Electronically Signed   By: Donavan Foil M.D.   On: 08/09/2018 19:20   Vas [place] Lower Extremity Venous (dvt)  Result Date: 08/10/2018  Lower Venous Study Indications: Edema.  Performing Technologist: Abram Sander RVS  Examination Guidelines: A complete evaluation includes B-mode imaging, spectral Doppler, color Doppler, and power Doppler as needed of all accessible portions of each vessel. Bilateral testing is considered an integral part of a complete examination. Limited examinations for reoccurring indications may be performed as noted.  Right Venous Findings: +---------+---------------+---------+-----------+----------+-------+            Compressibility Phasicity Spontaneity Properties Summary  +---------+---------------+---------+-----------+----------+-------+  CFV       Full            Yes       Yes                             +---------+---------------+---------+-----------+----------+-------+  SFJ       Full                                                      +---------+---------------+---------+-----------+----------+-------+  FV Prox   Full                                                      +---------+---------------+---------+-----------+----------+-------+  FV Mid    Full                                                      +---------+---------------+---------+-----------+----------+-------+  FV Distal Full                                                      +---------+---------------+---------+-----------+----------+-------+  PFV       Full                                                       +---------+---------------+---------+-----------+----------+-------+  POP       Full            Yes       Yes                             +---------+---------------+---------+-----------+----------+-------+  PTV       Full                                                      +---------+---------------+---------+-----------+----------+-------+  PERO      Full                                                      +---------+---------------+---------+-----------+----------+-------+  Left Venous Findings: +---------+---------------+---------+-----------+----------+--------------+            Compressibility Phasicity Spontaneity Properties Summary         +---------+---------------+---------+-----------+----------+--------------+  CFV       Full            Yes       Yes                                    +---------+---------------+---------+-----------+----------+--------------+  SFJ       Full                                                             +---------+---------------+---------+-----------+----------+--------------+  FV Prox   Full                                                             +---------+---------------+---------+-----------+----------+--------------+  FV Mid    Full                                                             +---------+---------------+---------+-----------+----------+--------------+  FV Distal Full                                                             +---------+---------------+---------+-----------+----------+--------------+  PFV       Full                                                             +---------+---------------+---------+-----------+----------+--------------+  POP       Full            Yes       Yes                                    +---------+---------------+---------+-----------+----------+--------------+  PTV       Full                                                             +---------+---------------+---------+-----------+----------+--------------+   PERO                                                       Not visualized  +---------+---------------+---------+-----------+----------+--------------+    Summary: Right: There is no evidence of deep vein thrombosis in the lower extremity. No cystic structure found in the popliteal fossa. Left: There is no evidence of deep vein thrombosis in the lower extremity. No cystic structure found in the popliteal fossa.  *See table(s) above for measurements and observations.    Preliminary     Assessment and Plan:   Chest pain -Pt had shortness of breath and mild chest tightness associated with being in atrial flutter.  -Troponins X3 -No active disease on chest x-ray.  Borderline to mild cardiomegaly. -D-dimer was not elevated at 0.46 -CVD risk factors include prediabetes. His father and brother had MI.  -His symptoms are very likely related to arrhythmia but cannot rule out underlying CAD. Echocardiogram ordered. Will check for LV function and wall motion.   Atrial flutter -Heart rate initially 115 bpm on EKG.  Rate improved with no intervention, now in the 70s. -Hemodynamically stable.  -Potassium was mildly elevated on presentation at 5.3.  Magnesium was normal at 2.1.  The patient is not anemic and white blood cell count is normal. -TSH was normal at 2.793 -Although rates are well controlled, pt is  still having some intermittent mild shortness of breath. Considering he appears to be quite symptomatic with the aflutter, will plan for electrical cardioversion tomorrow afternoon.  -CHA2DS2/VAS Stroke Risk Score is at least 3 (Age (1), DM). Pt is currently anticoagulated with IV heparin for stroke risk reduction.  Will switch to Eliquis 5 mg BID and plan for DCCV after 3 doses.  -Baseline HR per EKG in 2015 was 41 bpm so probably would not tolerated beta blockade.  -Pt was taking aspirin for general health and has been told to stop aspirin.   AKI -Creatinine 1.65 on presentation, 1.47 this  morning -Patient was mildly hyperkalemic with potassium of 5.3 on presentation, 4.1 today  Lower extremity edema -Lower extremity ultrasound per primary showed no evidence of DVT.  Diabetes type 2 -Currently not on any treatment.  A1c was 6.4 in 11/2016 per [facility] health care. -Blood sugars only mildly elevated on labs. Not on routine finger sticks.       For questions or updates, please contact [place] Please consult [email or web address] for contact info under     Signed, Daune Perch, NP  08/10/2018 10:21 AM   Patient seen and examined and history reviewed. Agree with above findings and plan. Very pleasant 78 yo WM seen for evaluation of new onset Atrial flutter. Previously healthy until yesterday am awoke with sensation of fluttering associated with SOB. Symptoms persisted and he was seen by primary care. Noted to be in Atrial flutter with rate 115 bpm. Admitted and started on IV heparin. Has ruled out for MI. HR now in 70-80s on no rate control therapy. Baseline HR noted to be 41 in 2015. No prior cardiac history  On exam he is a pleasant overweight WM in NAD No JVD or bruits Lungs clear.  CV IRR without gallop or murmur No edema, pulses 2+  Ecg shows Atrial flutter. Otherwise normal.  Creatinine 1.47. other labs normal CXR normal.  Impression: 1. New onset Atrial flutter. Rate controlled on no medication. He is symptomatic. Anticoagulation initiated within 12 hours of onset of symptoms. [place] vasc score of 2. Will change IV heparin to Eliquis. Check Echo. Plan DCCV tomorrow am. Anticipate he could be DC post cardioversion.  Caedmon Louque [place], [place] 08/10/2018 1:20 PM

## 2018-08-10 NOTE — Progress Notes (Signed)
  Echocardiogram 2D Echocardiogram has been attempted. Physician consult in progress for 20 min. Will reattempt at later time.  Camp Gopal G Modene Andy 08/10/2018, 10:53 AM

## 2018-08-10 NOTE — Progress Notes (Signed)
PROGRESS NOTE    Robert Summers  VEH:[id number] DOB: 10/02/1940 DOA: 08/09/2018 PCP: Manfred Shirts, PA     Brief Narrative:  Robert Summers is a 78 y.o. male with history of prostate cancer in remission was referred to the ER by patient's primary care physician after patient was found to be in atrial flutter with RVR.  Patient started experiencing chest pressure this morning after he woke up.  He also found some difficulty taking deep breath with some discomfort.  It lasted for almost 2 to 3 hours improved after breakfast.  Patient went shopping for his errands and later in the afternoon around 1 PM started experiencing similar symptoms again at this point patient decided to go to his PCP.  Was found to be in atrial flutter with RVR and was transferred to [facility].  New events last 24 hours / Subjective: No complaints this morning, denies any chest pain or pressure, palpitations, shortness of breath.   Assessment & Plan:   Principal Problem:   Atrial flutter with rapid ventricular response (HCC) Active Problems:   Chest pain   AKI (acute kidney injury) ([place])   Atrial flutter with RVR Cardiology consulted [id number] 2  IV heparin Echocardiogram pending  Chest pain Troponin negative x4 Cardiology consulted Echocardiogram pending  AKI on chronic kidney disease stage II Baseline Cr 1.2 Improving     DVT prophylaxis: IV heparin Code Status: Full code Family Communication: At bedside Disposition Plan: Pending cardiology evaluation, echocardiogram pending   Consultants:   Cardiology  Procedures:   None  Antimicrobials:  Anti-infectives (From admission, onward)   None       Objective: Vitals:   08/09/18 2130 08/09/18 2241 08/09/18 2249 08/10/18 0542  BP: 112/75  133/80 114/78  Pulse: 67  76 78  Resp: 15  20 20   Temp:   97.9 F (36.6 C) 97.9 F (36.6 C)  TempSrc:   Oral Oral  SpO2: 96%  97% 94%  Weight:  106.3 kg  105 kg  Height:  5\' 10"  (1.778 m)       Intake/Output Summary (Last 24 hours) at 08/10/2018 1250 Last data filed at 08/10/2018 0930 Gross per 24 hour  Intake 119.32 ml  Output --  Net 119.32 ml   Filed Weights   08/09/18 1842 08/09/18 2241 08/10/18 0542  Weight: 107 kg 106.3 kg 105 kg    Examination:  General exam: Appears calm and comfortable  Respiratory system: Clear to auscultation. Respiratory effort normal. Cardiovascular system: S1 & S2 heard, RRR. No JVD, murmurs, rubs, gallops or clicks. No pedal edema. Gastrointestinal system: Abdomen is nondistended, soft and nontender. No organomegaly or masses felt. Normal bowel sounds heard. Central nervous system: Alert and oriented. No focal neurological deficits. Extremities: Symmetric 5 x 5 power. Skin: No rashes, lesions or ulcers Psychiatry: Judgement and insight appear normal. Mood & affect appropriate.   Data Reviewed: I have personally reviewed following labs and imaging studies  CBC: Recent Labs  Lab 08/09/18 1909 08/10/18 0402  WBC 9.0 7.2  NEUTROABS 6.3  --   HGB 16.9 16.8  HCT 50.0 48.0  MCV 89.0 87.8  PLT 160 836   Basic Metabolic Panel: Recent Labs  Lab 08/09/18 1909 08/10/18 0402  NA 136 139  K 5.3* 4.1  CL 105 107  CO2 25 26  GLUCOSE 128* 99  BUN 19 18  CREATININE 1.65* 1.47*  CALCIUM 9.3 9.0  MG 2.1  --    GFR: Estimated Creatinine Clearance:  49.5 mL/min (A) (by C-G formula based on SCr of 1.47 mg/dL (H)). Liver Function Tests: No results for input(s): AST, ALT, ALKPHOS, BILITOT, PROT, ALBUMIN in the last 168 hours. No results for input(s): LIPASE, AMYLASE in the last 168 hours. No results for input(s): AMMONIA in the last 168 hours. Coagulation Profile: No results for input(s): INR, PROTIME in the last 168 hours. Cardiac Enzymes: Recent Labs  Lab 08/09/18 2322 08/10/18 0402 08/10/18 0931  TROPONINI <0.03 <0.03 <0.03   BNP (last 3 results) No results for input(s): PROBNP in the last 8760 hours. HbA1C: No results for  input(s): HGBA1C in the last 72 hours. CBG: No results for input(s): GLUCAP in the last 168 hours. Lipid Profile: No results for input(s): CHOL, HDL, LDLCALC, TRIG, CHOLHDL, LDLDIRECT in the last 72 hours. Thyroid Function Tests: Recent Labs    08/09/18 2107  TSH 2.793   Anemia Panel: No results for input(s): VITAMINB12, FOLATE, FERRITIN, TIBC, IRON, RETICCTPCT in the last 72 hours. Sepsis Labs: No results for input(s): PROCALCITON, LATICACIDVEN in the last 168 hours.  No results found for this or any previous visit (from the past 240 hour(s)).     Radiology Studies: Dg Chest Portable 1 View  Result Date: 08/09/2018 CLINICAL DATA:  Chest pain palpitations EXAM: PORTABLE CHEST 1 VIEW COMPARISON:  None. FINDINGS: Borderline to mild cardiomegaly. Both lungs are clear. The visualized skeletal structures are unremarkable. IMPRESSION: No active disease.  Borderline to mild cardiomegaly. Electronically Signed   By: Donavan Foil M.D.   On: 08/09/2018 19:20   Vas [place] Lower Extremity Venous (dvt)  Result Date: 08/10/2018  Lower Venous Study Indications: Edema.  Performing Technologist: Abram Sander RVS  Examination Guidelines: A complete evaluation includes B-mode imaging, spectral Doppler, color Doppler, and power Doppler as needed of all accessible portions of each vessel. Bilateral testing is considered an integral part of a complete examination. Limited examinations for reoccurring indications may be performed as noted.  Right Venous Findings: +---------+---------------+---------+-----------+----------+-------+            Compressibility Phasicity Spontaneity Properties Summary  +---------+---------------+---------+-----------+----------+-------+  CFV       Full            Yes       Yes                             +---------+---------------+---------+-----------+----------+-------+  SFJ       Full                                                       +---------+---------------+---------+-----------+----------+-------+  FV Prox   Full                                                      +---------+---------------+---------+-----------+----------+-------+  FV Mid    Full                                                      +---------+---------------+---------+-----------+----------+-------+  FV Distal Full                                                      +---------+---------------+---------+-----------+----------+-------+  PFV       Full                                                      +---------+---------------+---------+-----------+----------+-------+  POP       Full            Yes       Yes                             +---------+---------------+---------+-----------+----------+-------+  PTV       Full                                                      +---------+---------------+---------+-----------+----------+-------+  PERO      Full                                                      +---------+---------------+---------+-----------+----------+-------+  Left Venous Findings: +---------+---------------+---------+-----------+----------+--------------+            Compressibility Phasicity Spontaneity Properties Summary         +---------+---------------+---------+-----------+----------+--------------+  CFV       Full            Yes       Yes                                    +---------+---------------+---------+-----------+----------+--------------+  SFJ       Full                                                             +---------+---------------+---------+-----------+----------+--------------+  FV Prox   Full                                                             +---------+---------------+---------+-----------+----------+--------------+  FV Mid    Full                                                             +---------+---------------+---------+-----------+----------+--------------+  FV Distal Full                                                              +---------+---------------+---------+-----------+----------+--------------+  PFV       Full                                                             +---------+---------------+---------+-----------+----------+--------------+  POP       Full            Yes       Yes                                    +---------+---------------+---------+-----------+----------+--------------+  PTV       Full                                                             +---------+---------------+---------+-----------+----------+--------------+  PERO                                                       Not visualized  +---------+---------------+---------+-----------+----------+--------------+    Summary: Right: There is no evidence of deep vein thrombosis in the lower extremity. No cystic structure found in the popliteal fossa. Left: There is no evidence of deep vein thrombosis in the lower extremity. No cystic structure found in the popliteal fossa.  *See table(s) above for measurements and observations.    Preliminary       Scheduled Meds:  apixaban  5 mg Oral BID   aspirin EC  81 mg Oral Daily   Continuous Infusions:   LOS: 0 days    Time spent: 35 minutes   Dessa Phi, DO [facility] [email or web address] 08/10/2018, 12:50 PM

## 2018-08-10 NOTE — Progress Notes (Signed)
   Pt is scheduled for electrical cardioversion for tomorrow at 11:00. He will be NPO after midnight.   Daune Perch, AGNP-C [facility] 08/10/2018  12:41 PM Pager: [phone]

## 2018-08-10 NOTE — Consult Note (Addendum)
Cardiology Consultation:   Patient ID: BELLAMY JUDSON MRN: [id number]; DOB: 07-12-1940  Admit date: 08/09/2018 Date of Consult: 08/10/2018  Primary Care Provider: Manfred Shirts, PA Primary Cardiologist: Starleen Trussell [place], MD - New Primary Electrophysiologist:  None   Patient Profile:   Robert Summers is a 78 y.o. male with a hx of prostate cancer in remission, prediabetes, GERD, arthritis who is being seen today for the evaluation of atrial flutter at the request of Dr. Maylene Roes.  History of Present Illness:   Robert Summers was in his usual state of health until yesterday morning when he awoke early feeling restless.  After a while in bed with no improvement he got up to eat breakfast.  After he ate breakfast and was sitting at the table he began to feel difficulty breathing and mild chest tightness.  This lasted for couple of hours and then resolved.  He went out to run errands and felt okay.  After eating a late lunch around 2 he again developed shortness of breath and mild chest pressure.  This lasted for couple of hours and then around 4 he mentioned it to his wife who called their PCP.  He was able to be seen that afternoon.  He was found to be in atrial flutter with RVR and was referred to the ED.  Once in the ED he continued to be in atrial flutter but his rate was well controlled without any intervention.   The patient reports that he has had no prior cardiac issues.  He had a stress test once in the late 1990s which was normal.  He used to smoke for short time but quit at age 31.  He has an occasional glass of wine.  He has been told that he has prediabetes but does not take any medications or do any interventions.  He denies high blood pressure, stroke.  He says that he has always been very healthy.  He denies any recent illness.  His father died of a massive MI at age 59.  He has a brother who died of unknown cause, presumably an MI at age 34. 73 of his siblings have AAA, his sister had repair.    Past Medical History:  Diagnosis Date   Arthritis    KNEES   GERD (gastroesophageal reflux disease)    OCCASIONAL ONLY   H/O hiatal hernia    History of gout    History of nonmelanoma skin cancer    Prediabetes    Prostate cancer ([place])     Past Surgical History:  Procedure Laterality Date   CIRCUMCISION     ROBOT ASSISTED LAPAROSCOPIC RADICAL PROSTATECTOMY N/A 07/05/2013   Procedure: ROBOTIC ASSISTED LAPAROSCOPIC NERVE SPARING RADICAL PROSTATECTOMY;  Surgeon: Irine Seal, MD;  Location: [facility];  Service: Urology;  Laterality: N/A;   SPERMATACELE     REPAIRED   WRIST SURGERY     RT     Home Medications:  Prior to Admission medications   Medication Sig Start Date End Date Taking? Authorizing Provider  aspirin EC 81 MG tablet Take 81 mg by mouth daily.   Yes [provider]  cholecalciferol (VITAMIN D) 25 MCG (1000 UT) tablet Take 1,000 Units by mouth daily.   Yes [provider]  MAGNESIUM CARBONATE PO Take 1 capsule by mouth daily.   Yes [provider]  Multiple Vitamin (MULTIVITAMIN WITH MINERALS) TABS tablet Take 1 tablet by mouth daily.   Yes [provider]  Omega-3 Fatty Acids (  FISH OIL) 1000 MG CAPS Take 1 capsule by mouth daily.   Yes [provider]  furosemide (LASIX) 20 MG tablet Take 2 tablets (40 mg total) by mouth daily. Patient not taking: Reported on 08/09/2018 06/19/17   Jillyn Ledger, PA-C    Inpatient Medications: Scheduled Meds:  aspirin EC  81 mg Oral Daily   Continuous Infusions:  heparin 1,400 Units/hr (08/10/18 0353)   PRN Meds: acetaminophen **OR** acetaminophen, ondansetron **OR** ondansetron (ZOFRAN) IV  Allergies:    Allergies  Allergen Reactions   Lisinopril Other (See Comments)    Increased Serum Creatinine   Nsaids Other (See Comments)    CONTRAINDICATION: Renal Insufficiency   Latex Rash    Social History:   Social History   Socioeconomic History   Marital  status: Married    Spouse name: Not on file   Number of children: Not on file   Years of education: Not on file   Highest education level: Not on file  Occupational History   Not on file  Social Needs   Financial resource strain: Not on file   Food insecurity:    Worry: Not on file    Inability: Not on file   Transportation needs:    Medical: Not on file    Non-medical: Not on file  Tobacco Use   Smoking status: Former Smoker   Smokeless tobacco: Former Systems developer    Quit date: 06/28/1962  Substance and Sexual Activity   Alcohol use: Yes    Comment: RARE   Drug use: No   Sexual activity: Not on file  Lifestyle   Physical activity:    Days per week: Not on file    Minutes per session: Not on file   Stress: Not on file  Relationships   Social connections:    Talks on phone: Not on file    Gets together: Not on file    Attends religious service: Not on file    Active member of club or organization: Not on file    Attends meetings of clubs or organizations: Not on file    Relationship status: Not on file   Intimate partner violence:    Fear of current or ex partner: Not on file    Emotionally abused: Not on file    Physically abused: Not on file    Forced sexual activity: Not on file  Other Topics Concern   Not on file  Social History Narrative   Not on file    Family History:    Family History  Problem Relation Age of Onset   CAD Father    CAD Brother      ROS:  Please see the history of present illness.   All other ROS reviewed and negative.     Physical Exam/Data:   Vitals:   08/09/18 2130 08/09/18 2241 08/09/18 2249 08/10/18 0542  BP: 112/75  133/80 114/78  Pulse: 67  76 78  Resp: 15  20 20   Temp:   97.9 F (36.6 C) 97.9 F (36.6 C)  TempSrc:   Oral Oral  SpO2: 96%  97% 94%  Weight:  106.3 kg  105 kg  Height:  5\' 10"  (1.778 m)      Intake/Output Summary (Last 24 hours) at 08/10/2018 1021 Last data filed at 08/10/2018 0353 Gross  per 24 hour  Intake 119.32 ml  Output --  Net 119.32 ml   Last 3 Weights 08/10/2018 08/09/2018 08/09/2018  Weight (lbs) 231 lb 6.4 oz  234 lb 6.4 oz 236 lb  Weight (kg) 104.962 kg 106.323 kg 107.049 kg     Body mass index is 33.2 kg/m.  General:  Well nourished, well developed, in no acute distress HEENT: normal Lymph: no adenopathy Neck: no JVD Endocrine:  No thryomegaly Vascular: No carotid bruits; FA pulses 2+ bilaterally without bruits  Cardiac:  normal S1, S2; Slightly irregular rhythm; no murmur  Lungs:  clear to auscultation bilaterally, no wheezing, rhonchi or rales  Abd: soft, nontender, no hepatomegaly  Ext: no edema Musculoskeletal:  No deformities, BUE and BLE strength normal and equal Skin: warm and dry  Neuro:  CNs 2-12 intact, no focal abnormalities noted Psych:  Normal affect   EKG:  The EKG was personally reviewed and demonstrates: Atrial flutter, 115 bpm, Right axis deviation, Abnormal R-wave progression, late transition Telemetry:  Telemetry was personally reviewed and demonstrates: Atrial flutter in the 70s with 3:1 and 4:1 variable conduction  Relevant CV Studies:  Echo is pending  Laboratory Data:  Chemistry Recent Labs  Lab 08/09/18 1909 08/10/18 0402  NA 136 139  K 5.3* 4.1  CL 105 107  CO2 25 26  GLUCOSE 128* 99  BUN 19 18  CREATININE 1.65* 1.47*  CALCIUM 9.3 9.0  GFRNONAA 39* 45*  GFRAA 45* 52*  ANIONGAP 6 6    No results for input(s): PROT, ALBUMIN, AST, ALT, ALKPHOS, BILITOT in the last 168 hours. Hematology Recent Labs  Lab 08/09/18 1909 08/10/18 0402  WBC 9.0 7.2  RBC 5.62 5.47  HGB 16.9 16.8  HCT 50.0 48.0  MCV 89.0 87.8  MCH 30.1 30.7  MCHC 33.8 35.0  RDW 12.8 12.9  PLT 160 157   Cardiac Enzymes Recent Labs  Lab 08/09/18 2322 08/10/18 0402  TROPONINI <0.03 <0.03    Recent Labs  Lab 08/09/18 1919  TROPIPOC 0.02    BNPNo results for input(s): BNP, PROBNP in the last 168 hours.  DDimer  Recent Labs  Lab  08/10/18 0931  DDIMER 0.46    Radiology/Studies:  Dg Chest Portable 1 View  Result Date: 08/09/2018 CLINICAL DATA:  Chest pain palpitations EXAM: PORTABLE CHEST 1 VIEW COMPARISON:  None. FINDINGS: Borderline to mild cardiomegaly. Both lungs are clear. The visualized skeletal structures are unremarkable. IMPRESSION: No active disease.  Borderline to mild cardiomegaly. Electronically Signed   By: Donavan Foil M.D.   On: 08/09/2018 19:20   Vas [place] Lower Extremity Venous (dvt)  Result Date: 08/10/2018  Lower Venous Study Indications: Edema.  Performing Technologist: Abram Sander RVS  Examination Guidelines: A complete evaluation includes B-mode imaging, spectral Doppler, color Doppler, and power Doppler as needed of all accessible portions of each vessel. Bilateral testing is considered an integral part of a complete examination. Limited examinations for reoccurring indications may be performed as noted.  Right Venous Findings: +---------+---------------+---------+-----------+----------+-------+            Compressibility Phasicity Spontaneity Properties Summary  +---------+---------------+---------+-----------+----------+-------+  CFV       Full            Yes       Yes                             +---------+---------------+---------+-----------+----------+-------+  SFJ       Full                                                      +---------+---------------+---------+-----------+----------+-------+  FV Prox   Full                                                      +---------+---------------+---------+-----------+----------+-------+  FV Mid    Full                                                      +---------+---------------+---------+-----------+----------+-------+  FV Distal Full                                                      +---------+---------------+---------+-----------+----------+-------+  PFV       Full                                                       +---------+---------------+---------+-----------+----------+-------+  POP       Full            Yes       Yes                             +---------+---------------+---------+-----------+----------+-------+  PTV       Full                                                      +---------+---------------+---------+-----------+----------+-------+  PERO      Full                                                      +---------+---------------+---------+-----------+----------+-------+  Left Venous Findings: +---------+---------------+---------+-----------+----------+--------------+            Compressibility Phasicity Spontaneity Properties Summary         +---------+---------------+---------+-----------+----------+--------------+  CFV       Full            Yes       Yes                                    +---------+---------------+---------+-----------+----------+--------------+  SFJ       Full                                                             +---------+---------------+---------+-----------+----------+--------------+  FV Prox   Full                                                             +---------+---------------+---------+-----------+----------+--------------+  FV Mid    Full                                                             +---------+---------------+---------+-----------+----------+--------------+  FV Distal Full                                                             +---------+---------------+---------+-----------+----------+--------------+  PFV       Full                                                             +---------+---------------+---------+-----------+----------+--------------+  POP       Full            Yes       Yes                                    +---------+---------------+---------+-----------+----------+--------------+  PTV       Full                                                             +---------+---------------+---------+-----------+----------+--------------+   PERO                                                       Not visualized  +---------+---------------+---------+-----------+----------+--------------+    Summary: Right: There is no evidence of deep vein thrombosis in the lower extremity. No cystic structure found in the popliteal fossa. Left: There is no evidence of deep vein thrombosis in the lower extremity. No cystic structure found in the popliteal fossa.  *See table(s) above for measurements and observations.    Preliminary     Assessment and Plan:   Chest pain -Pt had shortness of breath and mild chest tightness associated with being in atrial flutter.  -Troponins X3 -No active disease on chest x-ray.  Borderline to mild cardiomegaly. -D-dimer was not elevated at 0.46 -CVD risk factors include prediabetes. His father and brother had MI.  -His symptoms are very likely related to arrhythmia but cannot rule out underlying CAD. Echocardiogram ordered. Will check for LV function and wall motion.   Atrial flutter -Heart rate initially 115 bpm on EKG.  Rate improved with no intervention, now in the 70s. -Hemodynamically stable.  -Potassium was mildly elevated on presentation at 5.3.  Magnesium was normal at 2.1.  The patient is not anemic and white blood cell count is normal. -TSH was normal at 2.793 -Although rates are well controlled, pt is  still having some intermittent mild shortness of breath. Considering he appears to be quite symptomatic with the aflutter, will plan for electrical cardioversion tomorrow afternoon.  -CHA2DS2/VAS Stroke Risk Score is at least 3 (Age (1), DM). Pt is currently anticoagulated with IV heparin for stroke risk reduction.  Will switch to Eliquis 5 mg BID and plan for DCCV after 3 doses.  -Baseline HR per EKG in 2015 was 41 bpm so probably would not tolerated beta blockade.  -Pt was taking aspirin for general health and has been told to stop aspirin.   AKI -Creatinine 1.65 on presentation, 1.47 this  morning -Patient was mildly hyperkalemic with potassium of 5.3 on presentation, 4.1 today  Lower extremity edema -Lower extremity ultrasound per primary showed no evidence of DVT.  Diabetes type 2 -Currently not on any treatment.  A1c was 6.4 in 11/2016 per [facility]. -Blood sugars only mildly elevated on labs. Not on routine finger sticks.       For questions or updates, please contact [place] Please consult [email or web address] for contact info under     Signed, Daune Perch, NP  08/10/2018 10:21 AM   Patient seen and examined and history reviewed. Agree with above findings and plan. Very pleasant 78 yo WM seen for evaluation of new onset Atrial flutter. Previously healthy until yesterday am awoke with sensation of fluttering associated with SOB. Symptoms persisted and he was seen by primary care. Noted to be in Atrial flutter with rate 115 bpm. Admitted and started on IV heparin. Has ruled out for MI. HR now in 70-80s on no rate control therapy. Baseline HR noted to be 41 in 2015. No prior cardiac history  On exam he is a pleasant overweight WM in NAD No JVD or bruits Lungs clear.  CV IRR without gallop or murmur No edema, pulses 2+  Ecg shows Atrial flutter. Otherwise normal.  Creatinine 1.47. other labs normal CXR normal.  Impression: 1. New onset Atrial flutter. Rate controlled on no medication. He is symptomatic. Anticoagulation initiated within 12 hours of onset of symptoms. [place] vasc score of 2. Will change IV heparin to Eliquis. Check Echo. Plan DCCV tomorrow am. Anticipate he could be DC post cardioversion.  Robert Summers, [place] 08/10/2018 1:20 PM

## 2018-08-10 NOTE — Progress Notes (Signed)
Lower extremity venous has been completed.   Preliminary results in CV Proc.   Abram Sander 08/10/2018 8:59 AM

## 2018-08-10 NOTE — Progress Notes (Signed)
  Echocardiogram 2D Echocardiogram has been performed.  Robert Summers 08/10/2018, 4:28 PM

## 2018-08-10 NOTE — Progress Notes (Signed)
ANTICOAGULATION CONSULT NOTE  Pharmacy Consult for heparin Indication: atrial fibrillation   Patient Measurements: Height: 5\' 10"  (177.8 cm) Weight: 231 lb 6.4 oz (105 kg) IBW/kg (Calculated) : 73 Heparin Dosing Weight: 96 kg  Vital Signs: Temp: 97.9 F (36.6 C) (03/11 0542) Temp Source: Oral (03/11 0542) BP: 114/78 (03/11 0542) Pulse Rate: 78 (03/11 0542)  Labs: Recent Labs    08/09/18 1909 08/09/18 2322 08/10/18 0402 08/10/18 0931  HGB 16.9  --  16.8  --   HCT 50.0  --  48.0  --   PLT 160  --  157  --   HEPARINUNFRC  --   --  0.58 0.55  CREATININE 1.65*  --  1.47*  --   TROPONINI  --  <0.03 <0.03 <0.03     Medical History: Past Medical History:  Diagnosis Date  . Arthritis    KNEES  . GERD (gastroesophageal reflux disease)    OCCASIONAL ONLY  . H/O hiatal hernia   . History of gout   . History of nonmelanoma skin cancer   . Prediabetes   . Prostate cancer [facility])     Assessment: 78 yo M presents with Afib. No anticoag PTA. Patient has been transitioned from heparin to apixaban. Planning DCCV tomorrow. H/h wnl.  Goal of Therapy:  Heparin level 0.3-0.7 units/ml Monitor platelets by anticoagulation protocol: Yes   Plan:  -Eliquis 5 mg po bid -Patient is close to requiring the lower dose given his age and renal function -Monitor for s/sx bleeding   Harvel Quale 08/10/2018,11:06 AM

## 2018-08-11 ENCOUNTER — Inpatient Hospital Stay (HOSPITAL_COMMUNITY): Payer: Medicare Other | Admitting: Anesthesiology

## 2018-08-11 ENCOUNTER — Encounter (HOSPITAL_COMMUNITY)
Admission: EM | Disposition: A | Discharge status: Home / Self Care | Payer: Self-pay | Source: Home / Self Care | Attending: Internal Medicine

## 2018-08-11 ENCOUNTER — Encounter (HOSPITAL_COMMUNITY): Payer: Self-pay | Admitting: *Deleted

## 2018-08-11 HISTORY — PX: CARDIOVERSION: SHX1299

## 2018-08-11 LAB — BASIC METABOLIC PANEL
Anion gap: 10 (ref 5–15)
BUN: 24 mg/dL — AB (ref 8–23)
CO2: 22 mmol/L (ref 22–32)
Calcium: 8.8 mg/dL — ABNORMAL LOW (ref 8.9–10.3)
Chloride: 106 mmol/L (ref 98–111)
Creatinine, Ser: 1.6 mg/dL — ABNORMAL HIGH (ref 0.61–1.24)
GFR calc Af Amer: 47 mL/min — ABNORMAL LOW (ref >60)
GFR calc non Af Amer: 40 mL/min — ABNORMAL LOW (ref >60)
GLUCOSE: 127 mg/dL — AB (ref 70–99)
Potassium: 4.1 mmol/L (ref 3.5–5.1)
Sodium: 138 mmol/L (ref 135–145)

## 2018-08-11 LAB — CBC
HCT: 48.2 % (ref 39.0–52.0)
HEMOGLOBIN: 17 g/dL (ref 13.0–17.0)
MCH: 30.9 pg (ref 26.0–34.0)
MCHC: 35.3 g/dL (ref 30.0–36.0)
MCV: 87.6 fL (ref 80.0–100.0)
Platelets: 160 10*3/uL (ref 150–400)
RBC: 5.5 MIL/uL (ref 4.22–5.81)
RDW: 12.8 % (ref 11.5–15.5)
WBC: 7.6 10*3/uL (ref 4.0–10.5)
nRBC: 0 % (ref 0.0–0.2)

## 2018-08-11 SURGERY — CARDIOVERSION
Anesthesia: General

## 2018-08-11 MED ORDER — APIXABAN 5 MG PO TABS: 5.0000 mg | ORAL_TABLET | Freq: Two times a day (BID) | ORAL | 0 refills | Status: DC

## 2018-08-11 MED ORDER — PROPOFOL 10 MG/ML IV BOLUS
INTRAVENOUS | Status: DC | PRN
  Administered 2018-08-11: 60 mg via INTRAVENOUS

## 2018-08-11 MED ORDER — SODIUM CHLORIDE 0.9 % IV SOLN
250.0000 mL | INTRAVENOUS | Status: DC
  Administered 2018-08-11: 250 mL via INTRAVENOUS

## 2018-08-11 MED ORDER — SODIUM CHLORIDE 0.9% FLUSH: 3.0000 mL | INTRAVENOUS | Status: DC | PRN

## 2018-08-11 MED ORDER — SODIUM CHLORIDE 0.9% FLUSH: 3.0000 mL | Freq: Two times a day (BID) | INTRAVENOUS | Status: DC

## 2018-08-11 MED ORDER — LIDOCAINE 2% (20 MG/ML) 5 ML SYRINGE
INTRAMUSCULAR | Status: DC | PRN
  Administered 2018-08-11: 60 mg via INTRAVENOUS

## 2018-08-11 MED FILL — ELIQUIS 5 MG TABLET: 5 | 30 days supply | Qty: 60 | Fill #0

## 2018-08-11 NOTE — Anesthesia Procedure Notes (Signed)
Procedure Name: General with mask airway Date/Time: 08/11/2018 1:17 PM Performed by: Imagene Riches, CRNA Pre-anesthesia Checklist: Patient identified, Emergency Drugs available, Suction available, Patient being monitored and Timeout performed Patient Re-evaluated:Patient Re-evaluated prior to induction Oxygen Delivery Method: Ambu bag Preoxygenation: Pre-oxygenation with 100% oxygen Induction Type: IV induction

## 2018-08-11 NOTE — Interval H&P Note (Signed)
History and Physical Interval Note:  08/11/2018 1:25 PM  Robert Summers  has presented today for surgery, with the diagnosis of afib.  The various methods of treatment have been discussed with the patient and family. After consideration of risks, benefits and other options for treatment, the patient has consented to  Procedure(s): CARDIOVERSION (N/A) as a surgical intervention.  The patient's history has been reviewed, patient examined, no change in status, stable for surgery.  I have reviewed the patient's chart and labs.  Questions were answered to the patient's satisfaction.     [name]

## 2018-08-11 NOTE — CV Procedure (Signed)
Procedure:   DCCV  Indication:  Symptomatic atrial flutter  Procedure Note:  The patient signed informed consent.  He has had had therapeutic anticoagulation with apixaban greater than 3 doses and had acute onset flutter, with anticoagulation started within 48 hours.  Anesthesia was administered by Dr. Marcie Bal.  Adequate airway was maintained throughout and vital followed per protocol.  He was cardioverted x1 with 120J of biphasic synchronized energy.  He converted to NSR.  There were no apparent complications.  The patient had normal neuro status and respiratory status post procedure with vitals stable as recorded elsewhere.     Buford Dresser, MD PhD 08/11/2018 1:26 PM

## 2018-08-11 NOTE — Anesthesia Preprocedure Evaluation (Signed)
Anesthesia Evaluation  Patient identified by MRN, date of birth, ID band Patient awake    Reviewed: Allergy & Precautions, H&P , NPO status , Patient's Chart, lab work & pertinent test results  Airway Mallampati: II   Neck ROM: full    Dental   Pulmonary former smoker,    breath sounds clear to auscultation       Cardiovascular + dysrhythmias Atrial Fibrillation  Rhythm:regular Rate:Normal     Neuro/Psych    GI/Hepatic hiatal hernia, GERD  ,  Endo/Other    Renal/GU Renal InsufficiencyRenal disease     Musculoskeletal  (+) Arthritis ,   Abdominal   Peds  Hematology   Anesthesia Other Findings   Reproductive/Obstetrics                             Anesthesia Physical Anesthesia Plan  ASA: III  Anesthesia Plan: General   Post-op Pain Management:    Induction: Intravenous  PONV Risk Score and Plan: 2 and Propofol infusion and Treatment may vary due to age or medical condition  Airway Management Planned: Mask  Additional Equipment:   Intra-op Plan:   Post-operative Plan:   Informed Consent: I have reviewed the patients History and Physical, chart, labs and discussed the procedure including the risks, benefits and alternatives for the proposed anesthesia with the patient or authorized representative who has indicated his/her understanding and acceptance.       Plan Discussed with: CRNA and Anesthesiologist  Anesthesia Plan Comments:         Anesthesia Quick Evaluation

## 2018-08-11 NOTE — Progress Notes (Signed)
Progress Note  Patient Name: Robert Summers Date of Encounter: 08/11/2018  Primary Cardiologist: Jenissa Tyrell [place], MD new  Subjective   Feels great this am. No dyspnea  Inpatient Medications    Scheduled Meds: . apixaban  5 mg Oral BID  . aspirin EC  81 mg Oral Daily   Continuous Infusions:  PRN Meds: acetaminophen **OR** acetaminophen, ondansetron **OR** ondansetron (ZOFRAN) IV   Vital Signs    Vitals:   08/10/18 0542 08/10/18 1419 08/10/18 1952 08/11/18 0513  BP: 114/78 123/82 (!) 149/77 124/75  Pulse: 78 (!) 116 80 78  Resp: 20 20 16    Temp: 97.9 F (36.6 C) 97.8 F (36.6 C) 97.9 F (36.6 C) 97.9 F (36.6 C)  TempSrc: Oral Oral Oral   SpO2: 94% 94% 98% 92%  Weight: 105 kg   104.7 kg  Height:       No intake or output data in the 24 hours ending 08/11/18 1029 Last 3 Weights 08/11/2018 08/10/2018 08/09/2018  Weight (lbs) 230 lb 14.4 oz 231 lb 6.4 oz 234 lb 6.4 oz  Weight (kg) 104.736 kg 104.962 kg 106.323 kg      Telemetry    Atrial flutter rate controlled. - Personally Reviewed  ECG    None today - Personally Reviewed  Physical Exam   GEN: No acute distress.   Neck: No JVD Cardiac: IRRR, no murmurs, rubs, or gallops.  Respiratory: Clear to auscultation bilaterally. GI: Soft, nontender, non-distended  MS: No edema; No deformity. Neuro:  Nonfocal  Psych: Normal affect   Labs    Chemistry Recent Labs  Lab 08/09/18 1909 08/10/18 0402 08/11/18 0549  NA 136 139 138  K 5.3* 4.1 4.1  CL 105 107 106  CO2 25 26 22   GLUCOSE 128* 99 127*  BUN 19 18 24*  CREATININE 1.65* 1.47* 1.60*  CALCIUM 9.3 9.0 8.8*  GFRNONAA 39* 45* 40*  GFRAA 45* 52* 47*  ANIONGAP 6 6 10      Hematology Recent Labs  Lab 08/09/18 1909 08/10/18 0402 08/11/18 0549  WBC 9.0 7.2 7.6  RBC 5.62 5.47 5.50  HGB 16.9 16.8 17.0  HCT 50.0 48.0 48.2  MCV 89.0 87.8 87.6  MCH 30.1 30.7 30.9  MCHC 33.8 35.0 35.3  RDW 12.8 12.9 12.8  PLT 160 157 160    Cardiac Enzymes  Recent Labs  Lab 08/09/18 2322 08/10/18 0402 08/10/18 0931  TROPONINI <0.03 <0.03 <0.03    Recent Labs  Lab 08/09/18 1919  TROPIPOC 0.02     BNPNo results for input(s): BNP, PROBNP in the last 168 hours.   DDimer  Recent Labs  Lab 08/10/18 0931  DDIMER 0.46     Radiology    Dg Chest Portable 1 View  Result Date: 08/09/2018 CLINICAL DATA:  Chest pain palpitations EXAM: PORTABLE CHEST 1 VIEW COMPARISON:  None. FINDINGS: Borderline to mild cardiomegaly. Both lungs are clear. The visualized skeletal structures are unremarkable. IMPRESSION: No active disease.  Borderline to mild cardiomegaly. Electronically Signed   By: Donavan Foil M.D.   On: 08/09/2018 19:20   Vas [place] Lower Extremity Venous (dvt)  Result Date: 08/10/2018  Lower Venous Study Indications: Edema.  Performing Technologist: Abram Sander RVS  Examination Guidelines: A complete evaluation includes B-mode imaging, spectral Doppler, color Doppler, and power Doppler as needed of all accessible portions of each vessel. Bilateral testing is considered an integral part of a complete examination. Limited examinations for reoccurring indications may be performed as noted.  Right Venous  Findings: +---------+---------------+---------+-----------+----------+-------+          CompressibilityPhasicitySpontaneityPropertiesSummary +---------+---------------+---------+-----------+----------+-------+ CFV      Full           Yes      Yes                          +---------+---------------+---------+-----------+----------+-------+ SFJ      Full                                                 +---------+---------------+---------+-----------+----------+-------+ FV Prox  Full                                                 +---------+---------------+---------+-----------+----------+-------+ FV Mid   Full                                                  +---------+---------------+---------+-----------+----------+-------+ FV DistalFull                                                 +---------+---------------+---------+-----------+----------+-------+ PFV      Full                                                 +---------+---------------+---------+-----------+----------+-------+ POP      Full           Yes      Yes                          +---------+---------------+---------+-----------+----------+-------+ PTV      Full                                                 +---------+---------------+---------+-----------+----------+-------+ PERO     Full                                                 +---------+---------------+---------+-----------+----------+-------+  Left Venous Findings: +---------+---------------+---------+-----------+----------+--------------+          CompressibilityPhasicitySpontaneityPropertiesSummary        +---------+---------------+---------+-----------+----------+--------------+ CFV      Full           Yes      Yes                                 +---------+---------------+---------+-----------+----------+--------------+ SFJ      Full                                                        +---------+---------------+---------+-----------+----------+--------------+  FV Prox  Full                                                        +---------+---------------+---------+-----------+----------+--------------+ FV Mid   Full                                                        +---------+---------------+---------+-----------+----------+--------------+ FV DistalFull                                                        +---------+---------------+---------+-----------+----------+--------------+ PFV      Full                                                        +---------+---------------+---------+-----------+----------+--------------+ POP      Full            Yes      Yes                                 +---------+---------------+---------+-----------+----------+--------------+ PTV      Full                                                        +---------+---------------+---------+-----------+----------+--------------+ PERO                                                  Not visualized +---------+---------------+---------+-----------+----------+--------------+    Summary: Right: There is no evidence of deep vein thrombosis in the lower extremity. No cystic structure found in the popliteal fossa. Left: There is no evidence of deep vein thrombosis in the lower extremity. No cystic structure found in the popliteal fossa.  *See table(s) above for measurements and observations. Electronically signed by Harold Barban MD on 08/10/2018 at 6:51:15 PM.    Final     Cardiac Studies   Echo 08/10/18:IMPRESSIONS    1. The left ventricle has normal systolic function, with an ejection fraction of 55-60%. The cavity size was normal. Left ventricular diastolic function could not be evaluated due to nondiagnostic images.  2. Extremely limited views without use of contrast. Definity (echo contrast) shows grossly normal function, no clear wall motion abnormalities. Anteroseptum appears to have an area that appears thinner at rest, but it does contract.  3. The right ventricle has normal systolic function. The cavity was normal. There is no increase in right ventricular wall thickness. Right ventricular systolic pressure could not be assessed.  4. Left atrial size  was not well visualized.  5. The mitral valve is grossly normal.  6. The tricuspid valve is grossly normal.  7. The aortic valve is tricuspid Mild calcification of the aortic valve. Aortic valve regurgitation was not assessed by color flow Doppler  Patient Profile     78 y.o. male presents with new onset atrial flutter  Assessment & Plan    1. Atrial flutter. New onset. Rate controlled on  no AV nodal blocking agents suggesting some underlying conduction system disease. On Eliquis now. Echo looks good. Plan DCCV today. No ASA or NSAIDs. Anticipate DC today post DCCV. Will need follow up in my office in 2-3 weeks.  2. CKD stage 2-3.  3. Prediabetes.       For questions or updates, please contact [place] Please consult [email or web address] for contact info under        Signed, Leaha Cuervo Martin[place], MD  08/11/2018, 10:29 AM

## 2018-08-11 NOTE — TOC Benefit Eligibility Note (Addendum)
Transition of Care [facility]) Benefit Eligibility Note    Patient Details  Name: Robert Summers MRN: [id number] Date of Birth: [birth year]  Patient has not met his $100.00 deductible His pharmacy is [facility]                                Kerin Salen Phone Number: 08/11/2018, 11:42 AM

## 2018-08-11 NOTE — Transfer of Care (Signed)
Immediate Anesthesia Transfer of Care Note  Patient: Robert Summers  Procedure(s) Performed: CARDIOVERSION (N/A )  Patient Location: Endoscopy Unit  Anesthesia Type:General  Level of Consciousness: awake and alert   Airway & Oxygen Therapy: Patient Spontanous Breathing  Post-op Assessment: Report given to RN and Post -op Vital signs reviewed and stable  Post vital signs: Reviewed and stable  Last Vitals:  Vitals Value Taken Time  BP 123/89 08/11/2018  1:19 PM  Temp    Pulse 80 08/11/2018  1:19 PM  Resp 14 08/11/2018  1:19 PM  SpO2 91 % 08/11/2018  1:19 PM    Last Pain:  Vitals:   08/11/18 1319  TempSrc: Oral  PainSc:       Patients Stated Pain Goal: 0 (40/97/35 3299)  Complications: No apparent anesthesia complications

## 2018-08-11 NOTE — Discharge Summary (Signed)
Physician Discharge Summary  Robert Summers ZOX:[id number] DOB: 30-Jun-1940 DOA: 08/09/2018  PCP: Manfred Shirts, PA  Admit date: 08/09/2018 Discharge date: 08/11/2018  Admitted From: Home Disposition:  Home  Recommendations for Outpatient Follow-up:  1. Follow up with PCP in 1 week 2. Follow up with Cardiology in 2-3 weeks, Dr. Martinique   Discharge Condition: Stable CODE STATUS: Full  Diet recommendation: Heart healthy   Brief/Interim Summary: Robert Fiveash Powellis a 78 y.o.malewithhistory of prostate cancer in remission was referred to the ER by patient's primary care physician after patient was found to be in atrial flutter with RVR. Patient started experiencing chest pressure this morning after he woke up. He also found some difficulty taking deep breath with some discomfort. It lasted for almost 2 to 3 hours improved after breakfast. Patient went shopping for his errands and later in the afternoon around 1 PM started experiencing similar symptoms again at this point patient decided to go to his PCP. Was found to be in atrial flutter with RVR and was transferred to [facility]. His HR remained well controlled off nodal blocking agents. He was started on IV heparin and transitioned to Eliquis. He was evaluated by cardiology and underwent cardioversion with conversion to normal sinus rhythm.   Discharge Diagnoses:  Principal Problem:   Atrial flutter with rapid ventricular response (HCC) Active Problems:   Chest pain   AKI (acute kidney injury) ([place])   Atrial flutter ([place]) CKD stage 2    Discharge Instructions  Discharge Instructions    Call MD for:  difficulty breathing, headache or visual disturbances   Complete by:  As directed    Call MD for:  extreme fatigue   Complete by:  As directed    Call MD for:  persistant dizziness or light-headedness   Complete by:  As directed    Call MD for:  persistant nausea and vomiting   Complete by:  As directed    Call MD for:  severe  uncontrolled pain   Complete by:  As directed    Diet - low sodium heart healthy   Complete by:  As directed    Discharge instructions   Complete by:  As directed    You were cared for by a hospitalist during your hospital stay. If you have any questions about your discharge medications or the care you received while you were in the hospital after you are discharged, you can call the unit and ask to speak with the hospitalist on call if the hospitalist that took care of you is not available. Once you are discharged, your primary care physician will handle any further medical issues. Please note that NO REFILLS for any discharge medications will be authorized once you are discharged, as it is imperative that you return to your primary care physician (or establish a relationship with a primary care physician if you do not have one) for your aftercare needs so that they can reassess your need for medications and monitor your lab values.   Increase activity slowly   Complete by:  As directed      Allergies as of 08/11/2018      Reactions   Lisinopril Other (See Comments)   Increased Serum Creatinine   Nsaids Other (See Comments)   CONTRAINDICATION: Renal Insufficiency   Latex Rash      Medication List    STOP taking these medications   aspirin EC 81 MG tablet   furosemide 20 MG tablet Commonly known as:  LASIX     TAKE these medications   apixaban 5 MG Tabs tablet Commonly known as:  ELIQUIS Take 1 tablet (5 mg total) by mouth 2 (two) times daily.   cholecalciferol 25 MCG (1000 UT) tablet Commonly known as:  VITAMIN D Take 1,000 Units by mouth daily.   Fish Oil 1000 MG Caps Take 1 capsule by mouth daily.   MAGNESIUM CARBONATE PO Take 1 capsule by mouth daily.   multivitamin with minerals Tabs tablet Take 1 tablet by mouth daily.      Follow-up Information    Manfred Shirts, [place]. Schedule an appointment as soon as possible for a visit in 1 week(s).   Specialty:  General  Practice Contact information: [address] [place] [address] [phone]        Martinique, Peter M, MD. Schedule an appointment as soon as possible for a visit in 2 week(s).   Specialty:  Cardiology Contact information: [address] [place] [address] [phone]          Allergies  Allergen Reactions  . Lisinopril Other (See Comments)    Increased Serum Creatinine  . Nsaids Other (See Comments)    CONTRAINDICATION: Renal Insufficiency  . Latex Rash    Consultations:  Cardiology    Procedures/Studies: Dg Chest Portable 1 View  Result Date: 08/09/2018 CLINICAL DATA:  Chest pain palpitations EXAM: PORTABLE CHEST 1 VIEW COMPARISON:  None. FINDINGS: Borderline to mild cardiomegaly. Both lungs are clear. The visualized skeletal structures are unremarkable. IMPRESSION: No active disease.  Borderline to mild cardiomegaly. Electronically Signed   By: Donavan Foil M.D.   On: 08/09/2018 19:20   Vas [place] Lower Extremity Venous (dvt)  Result Date: 08/10/2018  Lower Venous Study Indications: Edema.  Performing Technologist: Abram Sander RVS  Examination Guidelines: A complete evaluation includes B-mode imaging, spectral Doppler, color Doppler, and power Doppler as needed of all accessible portions of each vessel. Bilateral testing is considered an integral part of a complete examination. Limited examinations for reoccurring indications may be performed as noted.  Right Venous Findings: +---------+---------------+---------+-----------+----------+-------+          CompressibilityPhasicitySpontaneityPropertiesSummary +---------+---------------+---------+-----------+----------+-------+ CFV      Full           Yes      Yes                          +---------+---------------+---------+-----------+----------+-------+ SFJ      Full                                                 +---------+---------------+---------+-----------+----------+-------+ FV  Prox  Full                                                 +---------+---------------+---------+-----------+----------+-------+ FV Mid   Full                                                 +---------+---------------+---------+-----------+----------+-------+ FV DistalFull                                                 +---------+---------------+---------+-----------+----------+-------+  PFV      Full                                                 +---------+---------------+---------+-----------+----------+-------+ POP      Full           Yes      Yes                          +---------+---------------+---------+-----------+----------+-------+ PTV      Full                                                 +---------+---------------+---------+-----------+----------+-------+ PERO     Full                                                 +---------+---------------+---------+-----------+----------+-------+  Left Venous Findings: +---------+---------------+---------+-----------+----------+--------------+          CompressibilityPhasicitySpontaneityPropertiesSummary        +---------+---------------+---------+-----------+----------+--------------+ CFV      Full           Yes      Yes                                 +---------+---------------+---------+-----------+----------+--------------+ SFJ      Full                                                        +---------+---------------+---------+-----------+----------+--------------+ FV Prox  Full                                                        +---------+---------------+---------+-----------+----------+--------------+ FV Mid   Full                                                        +---------+---------------+---------+-----------+----------+--------------+ FV DistalFull                                                         +---------+---------------+---------+-----------+----------+--------------+ PFV      Full                                                        +---------+---------------+---------+-----------+----------+--------------+  POP      Full           Yes      Yes                                 +---------+---------------+---------+-----------+----------+--------------+ PTV      Full                                                        +---------+---------------+---------+-----------+----------+--------------+ PERO                                                  Not visualized +---------+---------------+---------+-----------+----------+--------------+    Summary: Right: There is no evidence of deep vein thrombosis in the lower extremity. No cystic structure found in the popliteal fossa. Left: There is no evidence of deep vein thrombosis in the lower extremity. No cystic structure found in the popliteal fossa.  *See table(s) above for measurements and observations. Electronically signed by Harold Barban MD on 08/10/2018 at 6:51:15 PM.    Final     Echo  IMPRESSIONS  1. The left ventricle has normal systolic function, with an ejection fraction of 55-60%. The cavity size was normal. Left ventricular diastolic function could not be evaluated due to nondiagnostic images.  2. Extremely limited views without use of contrast. Definity (echo contrast) shows grossly normal function, no clear wall motion abnormalities. Anteroseptum appears to have an area that appears thinner at rest, but it does contract.  3. The right ventricle has normal systolic function. The cavity was normal. There is no increase in right ventricular wall thickness. Right ventricular systolic pressure could not be assessed.  4. Left atrial size was not well visualized.  5. The mitral valve is grossly normal.  6. The tricuspid valve is grossly normal.  7. The aortic valve is tricuspid Mild calcification of the aortic  valve. Aortic valve regurgitation was not assessed by color flow Doppler.   Discharge Exam: Vitals:   08/11/18 1319 08/11/18 1325  BP: 123/89 122/71  Pulse: 80   Resp: 14   Temp: 97.9 F (36.6 C)   SpO2: 91% 92%    General: Pt is alert, awake, not in acute distress Cardiovascular: RRR, S1/S2 +, no rubs, no gallops Respiratory: CTA bilaterally, no wheezing, no rhonchi Abdominal: Soft, NT, ND, bowel sounds + Extremities: no edema, no cyanosis    The results of significant diagnostics from this hospitalization (including imaging, microbiology, ancillary and laboratory) are listed below for reference.     Microbiology: No results found for this or any previous visit (from the past 240 hour(s)).   Labs: BNP (last 3 results) No results for input(s): BNP in the last 8760 hours. Basic Metabolic Panel: Recent Labs  Lab 08/09/18 1909 08/10/18 0402 08/11/18 0549  NA 136 139 138  K 5.3* 4.1 4.1  CL 105 107 106  CO2 25 26 22   GLUCOSE 128* 99 127*  BUN 19 18 24*  CREATININE 1.65* 1.47* 1.60*  CALCIUM 9.3 9.0 8.8*  MG 2.1  --   --    Liver Function Tests: No results for input(s): AST, ALT,  ALKPHOS, BILITOT, PROT, ALBUMIN in the last 168 hours. No results for input(s): LIPASE, AMYLASE in the last 168 hours. No results for input(s): AMMONIA in the last 168 hours. CBC: Recent Labs  Lab 08/09/18 1909 08/10/18 0402 08/11/18 0549  WBC 9.0 7.2 7.6  NEUTROABS 6.3  --   --   HGB 16.9 16.8 17.0  HCT 50.0 48.0 48.2  MCV 89.0 87.8 87.6  PLT 160 157 160   Cardiac Enzymes: Recent Labs  Lab 08/09/18 2322 08/10/18 0402 08/10/18 0931  TROPONINI <0.03 <0.03 <0.03   BNP: Invalid input(s): POCBNP CBG: No results for input(s): GLUCAP in the last 168 hours. D-Dimer Recent Labs    08/10/18 0931  DDIMER 0.46   Hgb A1c No results for input(s): HGBA1C in the last 72 hours. Lipid Profile No results for input(s): CHOL, HDL, LDLCALC, TRIG, CHOLHDL, LDLDIRECT in the last 72  hours. Thyroid function studies Recent Labs    08/09/18 2107  TSH 2.793   Anemia work up No results for input(s): VITAMINB12, FOLATE, FERRITIN, TIBC, IRON, RETICCTPCT in the last 72 hours. Urinalysis    Component Value Date/Time   COLORURINE YELLOW 08/10/2018 1006   APPEARANCEUR CLEAR 08/10/2018 1006   LABSPEC 1.011 08/10/2018 1006   PHURINE 6.0 08/10/2018 1006   GLUCOSEU NEGATIVE 08/10/2018 1006   HGBUR NEGATIVE 08/10/2018 1006   BILIRUBINUR NEGATIVE 08/10/2018 1006   KETONESUR 20 (A) 08/10/2018 1006   PROTEINUR NEGATIVE 08/10/2018 1006   NITRITE NEGATIVE 08/10/2018 1006   LEUKOCYTESUR NEGATIVE 08/10/2018 1006   Sepsis Labs Invalid input(s): PROCALCITONIN,  WBC,  LACTICIDVEN Microbiology No results found for this or any previous visit (from the past 240 hour(s)).   Patient was seen and examined on the day of discharge and was found to be in stable condition. Time coordinating discharge: 25 minutes including assessment and coordination of care, as well as examination of the patient.   SIGNED:  Dessa Phi, DO [facility] [email or web address] 08/11/2018, 2:19 PM

## 2018-08-12 ENCOUNTER — Encounter: Payer: Self-pay | Admitting: Cardiology

## 2018-08-12 NOTE — Anesthesia Postprocedure Evaluation (Signed)
Anesthesia Post Note  Patient: Robert Summers  Procedure(s) Performed: CARDIOVERSION (N/A )     Patient location during evaluation: Endoscopy Anesthesia Type: General Level of consciousness: awake and alert Pain management: pain level controlled Vital Signs Assessment: post-procedure vital signs reviewed and stable Respiratory status: spontaneous breathing, nonlabored ventilation, respiratory function stable and patient connected to nasal cannula oxygen Cardiovascular status: blood pressure returned to baseline and stable Postop Assessment: no apparent nausea or vomiting Anesthetic complications: no    Last Vitals:  Vitals:   08/11/18 1319 08/11/18 1325  BP: 123/89 122/71  Pulse: 80   Resp: 14   Temp: 36.6 C   SpO2: 91% 92%    Last Pain:  Vitals:   08/11/18 1335  TempSrc:   PainSc: 0-No pain                 [name]

## 2018-08-13 ENCOUNTER — Encounter (HOSPITAL_COMMUNITY): Payer: Self-pay | Admitting: Cardiology

## 2018-08-19 ENCOUNTER — Telehealth: Payer: Self-pay

## 2018-08-19 NOTE — Telephone Encounter (Signed)
   Primary Cardiologist:  Peter Martinique, MD   Patient contacted.  History reviewed.  No symptoms to suggest any unstable cardiac conditions.  Based on discussion, with current pandemic situation, we will be postponing this 08/23/18 appointment for Robert Summers.  If symptoms change, he has been instructed to contact our office.   Appointment rescheduled to 10/11/18 at 11:40 am.  Kathyrn Lass, LPN  0/68/4033 5:33 PM         .

## 2018-08-22 ENCOUNTER — Telehealth: Payer: Self-pay

## 2018-08-22 MED ORDER — APIXABAN 5 MG PO TABS: 5.0000 mg | ORAL_TABLET | Freq: Two times a day (BID) | ORAL | 2 refills | Status: DC

## 2018-08-22 NOTE — Telephone Encounter (Signed)
Spoke to patient he stated he is almost out of Eliquis.Stated he was told his dose may be change.Spoke to [place] he advised to take 5 mg twice a day.Prescription sent to pharmacy.

## 2018-08-23 ENCOUNTER — Ambulatory Visit: Payer: Medicare Other | Admitting: Cardiology

## 2018-10-06 ENCOUNTER — Telehealth: Payer: Self-pay | Admitting: Cardiology

## 2018-10-06 NOTE — Telephone Encounter (Signed)
LVM regarding video or phone visit. °

## 2018-10-10 ENCOUNTER — Telehealth: Payer: Self-pay | Admitting: Physician Assistant

## 2018-10-10 NOTE — Telephone Encounter (Signed)
Follow Up:   Please call, he said he could not get into My-Chart.

## 2018-10-11 ENCOUNTER — Telehealth: Payer: Self-pay | Admitting: Physician Assistant

## 2018-10-11 ENCOUNTER — Telehealth (INDEPENDENT_AMBULATORY_CARE_PROVIDER_SITE_OTHER): Payer: Medicare Other | Admitting: Physician Assistant

## 2018-10-11 ENCOUNTER — Telehealth: Payer: Medicare Other | Admitting: Cardiology

## 2018-10-11 ENCOUNTER — Encounter: Payer: Self-pay | Admitting: *Deleted

## 2018-10-11 VITALS — HR 51 | Ht 70.0 in | Wt 225.0 lb

## 2018-10-11 DIAGNOSIS — R7303 Prediabetes: Secondary | ICD-10-CM

## 2018-10-11 DIAGNOSIS — I480 Paroxysmal atrial fibrillation: Secondary | ICD-10-CM | POA: Diagnosis not present

## 2018-10-11 DIAGNOSIS — N183 Chronic kidney disease, stage 3 unspecified: Secondary | ICD-10-CM

## 2018-10-11 NOTE — Telephone Encounter (Signed)
  Patient clicked on link sent for his appt but it is asking for an activation code. Please contact patient to help.

## 2018-10-11 NOTE — Telephone Encounter (Signed)
Called patient, sent code via text and email for mychart- advised if unable to get signed in to call us back.  Wife verbalized understanding.

## 2018-10-11 NOTE — Progress Notes (Signed)
Virtual Visit via Telephone Note   This visit type was conducted due to national recommendations for restrictions regarding the COVID-19 Pandemic (e.g. social distancing) in an effort to limit this patient's exposure and mitigate transmission in our community.  Due to his co-morbid illnesses, this patient is at least at moderate risk for complications without adequate follow up.  This format is felt to be most appropriate for this patient at this time.  The patient did not have access to video technology/had technical difficulties with video requiring transitioning to audio format only (telephone).  All issues noted in this document were discussed and addressed.  No physical exam could be performed with this format.  Please refer to the patient's chart for his  consent to telehealth for [facility].   Date:  10/11/2018   ID:  Robert Summers, DOB 02-13-1941, MRN [id number]  Patient Location: Home Provider Location: Home  PCP:  Manfred Shirts, PA  Cardiologist:  Peter Martinique, MD  Electrophysiologist:  None   Evaluation Performed:  Follow-Up Visit  Chief Complaint: Follow-up  History of Present Illness:    Robert Summers is a 78 y.o. male with past medical history of prostate cancer in remission, CKD stage II-III, prediabetes, GERD, arthritis who recently presented to the hospital after he woke up feeling restless.This was also accompanied by mild chest tightness that lasted for a few hours before resolving.  He saw his PCP who noted he was in atrial flutter with RVR and subsequently sent him to the emergency room.  He has no prior cardiac issues but did have a stress test in late 1990s which was normal.  He used to smoke for short time but quit at age 58.  He only has occasional glass of wine.  On arrival to the ED, he was found to be mildly hyperkalemic with potassium of 5.3.  Creatinine was mildly elevated at 1.65.  D-dimer was normal.  His chest discomfort was felt to be related to  atrial flutter.  TSH was normal.  EKG on arrival demonstrated atrial flutter with heart rate of 115 bpm, however heart rate improved to 70s with no intervention.  Baseline heart rate per EKG in 2015 was 41, so it was felt patient likely would not tolerate beta-blocker.  Patient was initiated on Eliquis.  Echocardiogram obtained on 08/10/2018 showed EF 55 to 60%, no wall motion abnormality.  He underwent successful DC cardioversion x1 on 08/11/2018 prior to discharge.  Patient was contacted today via telephone visit.  He has not had any further chest discomfort or shortness of breath since he left the hospital.  He does have occasional bruising however this is not anything more than his bruising when he was on aspirin before.  He will continue on the Eliquis 5 mg twice daily.  In February 2021, we need to reassess the dosage of Eliquis added to see if whether he qualify for the lower strength Eliquis.  We also briefly discussed the possibility of ablation in the future if he does have more frequent episodes.  Overall, I think he is doing quite well on the current medication and he can follow-up with Dr. Martinique in 3 months.  The patient does not have symptoms concerning for COVID-19 infection (fever, chills, cough, or new shortness of breath).    Past Medical History:  Diagnosis Date  . AKI (acute kidney injury) ([place]) 08/10/2018  . Arthritis    KNEES  . Atrial flutter ([place]) 08/10/2018  . Atrial flutter  with rapid ventricular response ([place]) 08/09/2018  . Chest pain 08/09/2018  . GERD (gastroesophageal reflux disease)    OCCASIONAL ONLY  . H/O hiatal hernia   . History of gout   . History of nonmelanoma skin cancer   . Malignant neoplasm of prostate ([place]) 07/05/2013  . Prediabetes   . Prostate cancer [facility])    Past Surgical History:  Procedure Laterality Date  . CARDIOVERSION N/A 08/11/2018   Procedure: CARDIOVERSION;  Surgeon: Buford Dresser, MD;  Location: [facility] ENDOSCOPY;  Service:  Cardiovascular;  Laterality: N/A;  . CIRCUMCISION    . ROBOT ASSISTED LAPAROSCOPIC RADICAL PROSTATECTOMY N/A 07/05/2013   Procedure: ROBOTIC ASSISTED LAPAROSCOPIC NERVE SPARING RADICAL PROSTATECTOMY;  Surgeon: Irine Seal, MD;  Location: [facility] ORS;  Service: Urology;  Laterality: N/A;  . SPERMATACELE     REPAIRED  . WRIST SURGERY     RT     Current Meds  Medication Sig  . apixaban (ELIQUIS) 5 MG TABS tablet Take 1 tablet (5 mg total) by mouth 2 (two) times daily.  . cholecalciferol (VITAMIN D) 25 MCG (1000 UT) tablet Take 1,000 Units by mouth daily.  Marland Kitchen MAGNESIUM CARBONATE PO Take 1 capsule by mouth daily.  . Multiple Vitamin (MULTIVITAMIN WITH MINERALS) TABS tablet Take 1 tablet by mouth daily.  . Omega-3 Fatty Acids (FISH OIL) 1000 MG CAPS Take 1 capsule by mouth daily.     Allergies:   Lisinopril; Nsaids; and Latex   Social History   Tobacco Use  . Smoking status: Former Research scientist (life sciences)  . Smokeless tobacco: Former Systems developer    Quit date: 06/28/1962  Substance Use Topics  . Alcohol use: Yes    Comment: RARE  . Drug use: No     Family Hx: The patient's family history includes AAA (abdominal aortic aneurysm) in his brother and sister; Arthritis in his father; CAD in his brother and father; Diabetes in his brother, brother, and mother; Hypertension in his brother and sister; Leukemia in his brother; Stroke in his mother.  ROS:   Please see the history of present illness.     All other systems reviewed and are negative.   Prior CV studies:   The following studies were reviewed today:  Echo 08/10/2018 IMPRESSIONS    1. The left ventricle has normal systolic function, with an ejection fraction of 55-60%. The cavity size was normal. Left ventricular diastolic function could not be evaluated due to nondiagnostic images.  2. Extremely limited views without use of contrast. Definity (echo contrast) shows grossly normal function, no clear wall motion abnormalities. Anteroseptum appears to have  an area that appears thinner at rest, but it does contract.  3. The right ventricle has normal systolic function. The cavity was normal. There is no increase in right ventricular wall thickness. Right ventricular systolic pressure could not be assessed.  4. Left atrial size was not well visualized.  5. The mitral valve is grossly normal.  6. The tricuspid valve is grossly normal.  7. The aortic valve is tricuspid Mild calcification of the aortic valve. Aortic valve regurgitation was not assessed by color flow Doppler.   Labs/Other Tests and Data Reviewed:    EKG:  An ECG dated 08/11/2018 was personally reviewed today and demonstrated:  Normal sinus rhythm  Recent Labs: 08/09/2018: Magnesium 2.1; TSH 2.793 08/11/2018: BUN 24; Creatinine, Ser 1.60; Hemoglobin 17.0; Platelets 160; Potassium 4.1; Sodium 138   Recent Lipid Panel No results found for: CHOL, TRIG, HDL, CHOLHDL, LDLCALC, LDLDIRECT  Wt Readings from Last  3 Encounters:  10/11/18 225 lb (102.1 kg)  08/11/18 230 lb 14.4 oz (104.7 kg)  07/05/13 243 lb 9.7 oz (110.5 kg)     Objective:    Vital Signs:  Pulse (!) 51   Ht 5\' 10"  (1.778 m)   Wt 225 lb (102.1 kg)   SpO2 97%   BMI 32.28 kg/m    VITAL SIGNS:  reviewed  ASSESSMENT & PLAN:    1. PAF: Recently underwent cardioversion in March 2020.  He was not placed on beta-blocker due to baseline bradycardia.  He has not had any chest discomfort and shortness of breath since which is the symptom he had during atrial fibrillation.  2. Prediabetes: Currently not on any diabetic medication.  Continue to diet and exercise.  3. CKD stage III: Follow-up by primary care provider  COVID-19 Education: The signs and symptoms of COVID-19 were discussed with the patient and how to seek care for testing (follow up with PCP or arrange E-visit).  The importance of social distancing was discussed today.  Time:   Today, I have spent 27 minutes with the patient with telehealth technology  discussing the above problems.     Medication Adjustments/Labs and Tests Ordered: Current medicines are reviewed at length with the patient today.  Concerns regarding medicines are outlined above.   Tests Ordered: No orders of the defined types were placed in this encounter.   Medication Changes: No orders of the defined types were placed in this encounter.   Disposition:  Follow up in 3 month(s)  Signed, Almyra Deforest, [place]  10/11/2018 1:41 PM    [facility]

## 2018-10-11 NOTE — Patient Instructions (Signed)
Your physician recommends that you schedule a follow-up appointment in: 3 MONTHS WITH DR Martinique  Your physician recommends that you continue on your current medications as directed. Please refer to the Current Medication list given to you today.  Thank you for choosing [facility]!!

## 2018-10-13 ENCOUNTER — Telehealth: Payer: Self-pay | Admitting: Physician Assistant

## 2018-10-13 NOTE — Telephone Encounter (Signed)
  Patient is calling because he was told by Isaac Laud at his visit 10/11/18 that he would received an email or call afterwards as a follow up to the visit but he has not heard anything as of yet. Please call.

## 2018-10-13 NOTE — Telephone Encounter (Signed)
Called patient regarding his question about not receiving a follow up e-mail or phone call.   Explained to patient that the only follow -up from his last visit was to have an appointment with Dr. Martinique in 3 months. Patient verbalized understanding. Also had question about accessing MyChart and its use. Explained what MyChart was and made sure he had access to it.  Will send message to scheduling pool to schedule appointment with Dr. Martinique.

## 2018-11-05 ENCOUNTER — Emergency Department (HOSPITAL_COMMUNITY)
Admission: EM | Admit: 2018-11-05 | Discharge: 2018-11-05 | Disposition: A | Discharge status: Home / Self Care | Payer: Medicare Other | Attending: Emergency Medicine | Admitting: Emergency Medicine

## 2018-11-05 ENCOUNTER — Other Ambulatory Visit: Payer: Self-pay

## 2018-11-05 ENCOUNTER — Encounter (HOSPITAL_COMMUNITY): Payer: Self-pay | Admitting: Emergency Medicine

## 2018-11-05 DIAGNOSIS — Z20828 Contact with and (suspected) exposure to other viral communicable diseases: Secondary | ICD-10-CM | POA: Diagnosis not present

## 2018-11-05 DIAGNOSIS — Z87891 Personal history of nicotine dependence: Secondary | ICD-10-CM | POA: Insufficient documentation

## 2018-11-05 DIAGNOSIS — Z79899 Other long term (current) drug therapy: Secondary | ICD-10-CM | POA: Insufficient documentation

## 2018-11-05 DIAGNOSIS — E875 Hyperkalemia: Secondary | ICD-10-CM

## 2018-11-05 DIAGNOSIS — E876 Hypokalemia: Secondary | ICD-10-CM | POA: Insufficient documentation

## 2018-11-05 DIAGNOSIS — Z7901 Long term (current) use of anticoagulants: Secondary | ICD-10-CM | POA: Insufficient documentation

## 2018-11-05 DIAGNOSIS — N189 Chronic kidney disease, unspecified: Secondary | ICD-10-CM | POA: Insufficient documentation

## 2018-11-05 DIAGNOSIS — Z8546 Personal history of malignant neoplasm of prostate: Secondary | ICD-10-CM | POA: Insufficient documentation

## 2018-11-05 DIAGNOSIS — R110 Nausea: Secondary | ICD-10-CM | POA: Diagnosis present

## 2018-11-05 LAB — URINALYSIS, ROUTINE W REFLEX MICROSCOPIC
Bacteria, UA: NONE SEEN
Bilirubin Urine: NEGATIVE
Glucose, UA: NEGATIVE mg/dL
Ketones, ur: 20 mg/dL — AB
Leukocytes,Ua: NEGATIVE
Nitrite: NEGATIVE
Protein, ur: 100 mg/dL — AB
Specific Gravity, Urine: 1.02 (ref 1.005–1.030)
pH: 5 (ref 5.0–8.0)

## 2018-11-05 LAB — CBC WITH DIFFERENTIAL/PLATELET
Abs Immature Granulocytes: 0.03 10*3/uL (ref 0.00–0.07)
Basophils Absolute: 0.1 10*3/uL (ref 0.0–0.1)
Basophils Relative: 1 %
Eosinophils Absolute: 0.1 10*3/uL (ref 0.0–0.5)
Eosinophils Relative: 2 %
HCT: 45.5 % (ref 39.0–52.0)
Hemoglobin: 15.6 g/dL (ref 13.0–17.0)
Immature Granulocytes: 0 %
Lymphocytes Relative: 17 %
Lymphs Abs: 1.3 10*3/uL (ref 0.7–4.0)
MCH: 30.2 pg (ref 26.0–34.0)
MCHC: 34.3 g/dL (ref 30.0–36.0)
MCV: 88.2 fL (ref 80.0–100.0)
Monocytes Absolute: 0.8 10*3/uL (ref 0.1–1.0)
Monocytes Relative: 11 %
Neutro Abs: 5.1 10*3/uL (ref 1.7–7.7)
Neutrophils Relative %: 69 %
Platelets: 204 10*3/uL (ref 150–400)
RBC: 5.16 MIL/uL (ref 4.22–5.81)
RDW: 12.6 % (ref 11.5–15.5)
WBC: 7.4 10*3/uL (ref 4.0–10.5)
nRBC: 0 % (ref 0.0–0.2)

## 2018-11-05 LAB — COMPREHENSIVE METABOLIC PANEL
ALT: 17 U/L (ref 0–44)
AST: 16 U/L (ref 15–41)
Albumin: 3 g/dL — ABNORMAL LOW (ref 3.5–5.0)
Alkaline Phosphatase: 48 U/L (ref 38–126)
Anion gap: 11 (ref 5–15)
BUN: 20 mg/dL (ref 8–23)
CO2: 22 mmol/L (ref 22–32)
Calcium: 9.2 mg/dL (ref 8.9–10.3)
Chloride: 100 mmol/L (ref 98–111)
Creatinine, Ser: 1.75 mg/dL — ABNORMAL HIGH (ref 0.61–1.24)
GFR calc Af Amer: 42 mL/min — ABNORMAL LOW (ref >60)
GFR calc non Af Amer: 36 mL/min — ABNORMAL LOW (ref >60)
Glucose, Bld: 172 mg/dL — ABNORMAL HIGH (ref 70–99)
Potassium: 5.3 mmol/L — ABNORMAL HIGH (ref 3.5–5.1)
Sodium: 133 mmol/L — ABNORMAL LOW (ref 135–145)
Total Bilirubin: 1.1 mg/dL (ref 0.3–1.2)
Total Protein: 6.4 g/dL — ABNORMAL LOW (ref 6.5–8.1)

## 2018-11-05 LAB — SARS CORONAVIRUS 2 BY RT PCR (HOSPITAL ORDER, PERFORMED IN ~~LOC~~ HOSPITAL LAB): SARS Coronavirus 2: NEGATIVE

## 2018-11-05 NOTE — Discharge Instructions (Signed)
Stay well hydrated. Have kidney function and potassium rechecked in 1 week. Return for fevers, chest pain, shortness of breath, new headache, neurologic symptoms or new concerns.

## 2018-11-05 NOTE — ED Notes (Signed)
Tara Wich (wife): [phone]

## 2018-11-05 NOTE — ED Notes (Signed)
Patient verbalizes understanding of discharge instructions. Opportunity for questioning and answers were provided. Armband removed by staff, pt discharged from ED ambulatory.   

## 2018-11-05 NOTE — ED Triage Notes (Signed)
Pt c/o nausea that started after eating a chick fil a today. Reports that nausea has since improved. Denies vomiting/abdominal pain/chest pain/shortness of breath.

## 2018-11-07 NOTE — ED Provider Notes (Signed)
[facility] EMERGENCY DEPARTMENT Provider Note   CSN: [id number] Arrival date & time: 11/05/18  1936    History   Chief Complaint Chief Complaint  Patient presents with   Nausea    HPI Robert Summers is a 78 y.o. male.     Patient with history of atrial flutter, reflux, prostate cancer presents with nausea that is since resolved.  Happened later this afternoon lasting for approximately 3 hours.  Started after eating [employer].  Patient was outside in heat earlier in the day.  Patient admits to not keeping up with the fluids.  No abdominal pain vomiting or history of gallbladder problems.  No chest pain or shortness of breath.  No COVID or sick contacts.     Past Medical History:  Diagnosis Date   AKI (acute kidney injury) ([place]) 08/10/2018   Arthritis    KNEES   Atrial flutter ([place]) 08/10/2018   Atrial flutter with rapid ventricular response ([place]) 08/09/2018   Chest pain 08/09/2018   GERD (gastroesophageal reflux disease)    OCCASIONAL ONLY   H/O hiatal hernia    History of gout    History of nonmelanoma skin cancer    Malignant neoplasm of prostate ([place]) 07/05/2013   Prediabetes    Prostate cancer [facility])     Patient Active Problem List   Diagnosis Date Noted   AKI (acute kidney injury) ([place]) 08/10/2018   Atrial flutter ([place]) 08/10/2018   Atrial flutter with rapid ventricular response ([place]) 08/09/2018   Chest pain 08/09/2018   Malignant neoplasm of prostate ([place]) 07/05/2013    Past Surgical History:  Procedure Laterality Date   CARDIOVERSION N/A 08/11/2018   Procedure: CARDIOVERSION;  Surgeon: Buford Dresser, MD;  Location: [facility];  Service: Cardiovascular;  Laterality: N/A;   CIRCUMCISION     ROBOT ASSISTED LAPAROSCOPIC RADICAL PROSTATECTOMY N/A 07/05/2013   Procedure: ROBOTIC ASSISTED LAPAROSCOPIC NERVE [place];  Surgeon: Irine Seal, MD;  Location[facility] ORS;  Service: Urology;  Laterality:  N/A;   SPERMATACELE     REPAIRED   WRIST SURGERY     RT        Home Medications    Prior to Admission medications   Medication Sig Start Date End Date Taking? Authorizing Provider  apixaban (ELIQUIS) 5 MG TABS tablet Take 1 tablet (5 mg total) by mouth 2 (two) times daily. 08/22/18   Martinique, Peter M, MD  cholecalciferol (VITAMIN D) 25 MCG (1000 UT) tablet Take 1,000 Units by mouth daily.    [provider]  MAGNESIUM CARBONATE PO Take 1 capsule by mouth daily.    [provider]  Multiple Vitamin (MULTIVITAMIN WITH MINERALS) TABS tablet Take 1 tablet by mouth daily.    [provider]  Omega-3 Fatty Acids (FISH OIL) 1000 MG CAPS Take 1 capsule by mouth daily.    [provider]  predniSONE (DELTASONE) 10 MG tablet Take 10 mg by mouth daily. 10/04/18   [provider]    Family History Family History  Problem Relation Age of Onset   CAD Father        Died of MI at age 52   Arthritis Father    CAD Brother        died of presumed Mi at age 71   Diabetes Brother    Diabetes Mother    Stroke Mother    AAA (abdominal aortic aneurysm) Sister    Hypertension Sister    Leukemia Brother    Diabetes  Brother    Hypertension Brother    AAA (abdominal aortic aneurysm) Brother     Social History Social History   Tobacco Use   Smoking status: Former Smoker   Smokeless tobacco: Former Systems developer    Quit date: 06/28/1962  Substance Use Topics   Alcohol use: Yes    Comment: RARE   Drug use: No     Allergies   Lisinopril; Nsaids; and Latex   Review of Systems Review of Systems  Constitutional: Negative for chills and fever.  HENT: Negative for congestion.   Eyes: Negative for visual disturbance.  Respiratory: Negative for shortness of breath.   Cardiovascular: Negative for chest pain.  Gastrointestinal: Positive for nausea. Negative for abdominal pain and vomiting.  Genitourinary: Negative for dysuria and flank pain.    Musculoskeletal: Negative for back pain, neck pain and neck stiffness.  Skin: Negative for rash.  Neurological: Negative for light-headedness and headaches.     Physical Exam Updated Vital Signs BP 127/78    Pulse 90    Temp 99.9 F (37.7 C) (Oral)    Resp 18    SpO2 95%   Physical Exam Vitals signs and nursing note reviewed.  Constitutional:      Appearance: He is well-developed.  HENT:     Head: Normocephalic and atraumatic.     Comments: Dry mucous membranes Eyes:     General:        Right eye: No discharge.        Left eye: No discharge.     Conjunctiva/sclera: Conjunctivae normal.  Neck:     Musculoskeletal: Normal range of motion and neck supple.     Trachea: No tracheal deviation.  Cardiovascular:     Rate and Rhythm: Normal rate and regular rhythm.  Pulmonary:     Effort: Pulmonary effort is normal.     Breath sounds: Normal breath sounds.  Abdominal:     General: There is no distension.     Palpations: Abdomen is soft.     Tenderness: There is no abdominal tenderness. There is no guarding.  Skin:    General: Skin is warm.     Findings: No rash.  Neurological:     Mental Status: He is alert and oriented to person, place, and time.     Cranial Nerves: No cranial nerve deficit.      ED Treatments / Results  Labs (all labs ordered are listed, but only abnormal results are displayed) Labs Reviewed  COMPREHENSIVE METABOLIC PANEL - Abnormal; Notable for the following components:      Result Value   Sodium 133 (*)    Potassium 5.3 (*)    Glucose, Bld 172 (*)    Creatinine, Ser 1.75 (*)    Total Protein 6.4 (*)    Albumin 3.0 (*)    GFR calc non Af Amer 36 (*)    GFR calc Af Amer 42 (*)    All other components within normal limits  URINALYSIS, ROUTINE W REFLEX MICROSCOPIC - Abnormal; Notable for the following components:   Hgb urine dipstick SMALL (*)    Ketones, ur 20 (*)    Protein, ur 100 (*)    All other components within normal limits  SARS  CORONAVIRUS 2 (HOSPITAL ORDER, [place] [facility])  CBC WITH DIFFERENTIAL/PLATELET    EKG None  Radiology No results found.  Procedures Procedures (including critical care time)  Medications Ordered in ED Medications - No data to display   Initial  Impression / Assessment and Plan / ED Course  I have reviewed the triage vital signs and the nursing notes.  Pertinent labs & imaging results that were available during my care of the patient were reviewed by me and considered in my medical decision making (see chart for details).       Patient presents after episode of significant nausea that resolved.  No other symptoms.  Patient is well-appearing and clinically mild dehydration.  Oral fluids given in the ER.  Screening blood work ordered and reviewed showing mild elevated potassium 5.3 and mild worsening kidney function 1.7.  Urinalysis shows small ketones no sign of infection.  Concern for mix of dehydration/heat intolerance.  Patient stable for outpatient follow-up primary doctor.  Results and differential diagnosis were discussed with the patient/parent/guardian. Xrays were independently reviewed by myself.  Close follow up outpatient was discussed, comfortable with the plan.   Medications - No data to display  Vitals:   11/05/18 1943 11/05/18 2137 11/05/18 2215 11/05/18 2330  BP: (!) 146/62  127/78   Pulse: 98 93 90 90  Resp: 18   18  Temp: 99.9 F (37.7 C)     TempSrc: Oral     SpO2: 98% 95% 93% 95%    Final diagnoses:  Chronic renal failure, unspecified CKD stage  Hyperkalemia  Nausea     Final Clinical Impressions(s) / ED Diagnoses   Final diagnoses:  Chronic renal failure, unspecified CKD stage  Hyperkalemia  Nausea    ED Discharge Orders    None       Elnora Morrison, MD 11/07/18 2354

## 2018-11-11 ENCOUNTER — Encounter: Payer: Self-pay | Admitting: Physician Assistant

## 2018-11-11 ENCOUNTER — Other Ambulatory Visit: Payer: Self-pay

## 2018-11-11 ENCOUNTER — Telehealth: Payer: Self-pay | Admitting: Cardiology

## 2018-11-11 ENCOUNTER — Ambulatory Visit (INDEPENDENT_AMBULATORY_CARE_PROVIDER_SITE_OTHER): Payer: Medicare Other | Admitting: Medical

## 2018-11-11 VITALS — BP 159/94 | HR 82 | Temp 97.2°F | Ht 70.0 in | Wt 220.8 lb

## 2018-11-11 DIAGNOSIS — Z789 Other specified health status: Secondary | ICD-10-CM

## 2018-11-11 DIAGNOSIS — N183 Chronic kidney disease, stage 3 unspecified: Secondary | ICD-10-CM

## 2018-11-11 DIAGNOSIS — I10 Essential (primary) hypertension: Secondary | ICD-10-CM

## 2018-11-11 DIAGNOSIS — I480 Paroxysmal atrial fibrillation: Secondary | ICD-10-CM | POA: Diagnosis not present

## 2018-11-11 DIAGNOSIS — E875 Hyperkalemia: Secondary | ICD-10-CM

## 2018-11-11 MED ORDER — CARVEDILOL 6.25 MG PO TABS: 6.2500 mg | ORAL_TABLET | Freq: Two times a day (BID) | ORAL | 3 refills | Status: DC

## 2018-11-11 NOTE — Progress Notes (Signed)
Cardiology Office Note   Date:  11/13/2018   ID:  Robert Summers, DOB August 09, 1940, MRN [id number]  PCP:  Robert Shirts, PA  Cardiologist:  Robert Martinique, MD EP: None  Chief Complaint  Patient presents with   Follow-up    paroxysmal atrial flutter      History of Present Illness: Robert Summers is a 78 y.o. male with PMH of paroxysmal atrial flutter s/p successful DCCV 07/2018, pre-DM type 2, GERD, and CKD stage 3 who presents for follow-up of his paroxysmal atrial flutter.  He was last evaluated by cardiology via a telemedicine visit with Robert Deforest, PA-C 10/11/2018, at which time he was without cardiac complaints and no medication changes occurred.   He presents today for follow-up of his paroxysmal atrial flutter. He reported recent ED visit 11/05/2018 for nausea which occurred after working in the heat all day. He attributed this to dehydration. During that ED visit he was noted to have Cr elevated to 1.75 (baseline 1.6) and mild hyperkalemia. He was discharged home with close follow-up with his PCP. He reported having blood work done yesterday with K 5.6. His PCP suggested he follow-up with cardiology to r/o recurrent arrhythmia. He has not experienced any epigastric discomfort or SOB similar to his initial atrial flutter presentation. No complaints of chest pain, dizziness, lightheadedness, or syncope. He reports BP primarily in the 140s/80s over the past several years with HR typically in the 70s-80s.      Past Medical History:  Diagnosis Date   AKI (acute kidney injury) ([place]) 08/10/2018   Arthritis    KNEES   Atrial flutter ([place]) 08/10/2018   Atrial flutter with rapid ventricular response ([place]) 08/09/2018   Chest pain 08/09/2018   GERD (gastroesophageal reflux disease)    OCCASIONAL ONLY   H/O hiatal hernia    History of gout    History of nonmelanoma skin cancer    Malignant neoplasm of prostate ([place]) 07/05/2013   Prediabetes    Prostate cancer [facility])      Past Surgical History:  Procedure Laterality Date   CARDIOVERSION N/A 08/11/2018   Procedure: CARDIOVERSION;  Surgeon: Robert Dresser, MD;  Location: [place];  Service: Cardiovascular;  Laterality: N/A;   CIRCUMCISION     ROBOT ASSISTED LAPAROSCOPIC RADICAL PROSTATECTOMY N/A 07/05/2013   Procedure: ROBOTIC ASSISTED LAPAROSCOPIC NERVE [place];  Surgeon: Robert Seal, MD;  Location: [facility];  Service: Urology;  Laterality: N/A;   SPERMATACELE     REPAIRED   WRIST SURGERY     RT     Current Outpatient Medications  Medication Sig Dispense Refill   apixaban (ELIQUIS) 5 MG TABS tablet Take 1 tablet (5 mg total) by mouth 2 (two) times daily. 180 tablet 2   cholecalciferol (VITAMIN D) 25 MCG (1000 UT) tablet Take 1,000 Units by mouth daily.     MAGNESIUM CARBONATE PO Take 1 capsule by mouth daily.     Multiple Vitamin (MULTIVITAMIN WITH MINERALS) TABS tablet Take 1 tablet by mouth daily.     Omega-3 Fatty Acids (FISH OIL) 1000 MG CAPS Take 1 capsule by mouth daily.     predniSONE (DELTASONE) 10 MG tablet Take 10 mg by mouth daily.     carvedilol (COREG) 6.25 MG tablet Take 1 tablet (6.25 mg total) by mouth 2 (two) times daily. 180 tablet 3   No current facility-administered medications for this visit.     Allergies:   Lisinopril, Nsaids, and Latex    Social  History:  The patient  reports that he has quit smoking. He quit smokeless tobacco use about 56 years ago. He reports current alcohol use. He reports that he does not use drugs.   Family History:  The patient's family history includes AAA (abdominal aortic aneurysm) in his brother and sister; Arthritis in his father; CAD in his brother and father; Diabetes in his brother, brother, and mother; Hypertension in his brother and sister; Leukemia in his brother; Stroke in his mother.    ROS:  Please see the history of present illness.   Otherwise, review of systems are positive for none.   All  other systems are reviewed and negative.    PHYSICAL EXAM: VS:  BP (!) 159/94    Pulse 82    Temp (!) 97.2 F (36.2 C)    Ht 5\' 10"  (1.778 m)    Wt 220 lb 12.8 oz (100.2 kg)    SpO2 90%    BMI 31.68 kg/m  , BMI Body mass index is 31.68 kg/m. GEN: Well nourished, well developed, in no acute distress HEENT: sclera anicteric Neck: no JVD, carotid bruits, or masses Cardiac: RRR; no murmurs, rubs, or gallops, trace LE edema  Respiratory:  clear to auscultation bilaterally, normal work of breathing GI: soft, nontender, nondistended, + BS MS: no deformity or atrophy Skin: warm and dry, no rash Neuro:  Strength and sensation are intact Psych: euthymic mood, full affect   EKG:  EKG is ordered today. The ekg ordered today demonstrates EKG Sinus rhythm with PVC, no STE/D, no TWI   Recent Labs: 08/09/2018: Magnesium 2.1; TSH 2.793 11/05/2018: ALT 17; Hemoglobin 15.6; Platelets 204 11/11/2018: BUN 15; Creatinine, Ser 1.41; Potassium 4.7; Sodium 136    Lipid Panel No results found for: CHOL, TRIG, HDL, CHOLHDL, VLDL, LDLCALC, LDLDIRECT    Wt Readings from Last 3 Encounters:  11/11/18 220 lb 12.8 oz (100.2 kg)  10/11/18 225 lb (102.1 kg)  08/11/18 230 lb 14.4 oz (104.7 kg)      Other studies Reviewed: Additional studies/ records that were reviewed today include:   Echocardiogram 07/2018: IMPRESSIONS    1. The left ventricle has normal systolic function, with an ejection fraction of 55-60%. The cavity size was normal. Left ventricular diastolic function could not be evaluated due to nondiagnostic images.  2. Extremely limited views without use of contrast. Definity (echo contrast) shows grossly normal function, no clear wall motion abnormalities. Anteroseptum appears to have an area that appears thinner at rest, but it does contract.  3. The right ventricle has normal systolic function. The cavity was normal. There is no increase in right ventricular wall thickness. Right ventricular  systolic pressure could not be assessed.  4. Left atrial size was not well visualized.  5. The mitral valve is grossly normal.  6. The tricuspid valve is grossly normal.  7. The aortic valve is tricuspid Mild calcification of the aortic valve. Aortic valve regurgitation was not assessed by color flow Doppler.  SUMMARY   Extremely limited windows without use of echo contrast. With contrast, grossly normal LV function, no apparent WMA or significant valve disease. In atrial flutter throughout study.    ASSESSMENT AND PLAN:  1. Paroxysmal atrial flutter: s/p cardioversion 07/2018. No recurrent symptoms c/w aflutter presentation. EKG today with sinus rhythm. No complaints of bleeding. HR in the 80s today.  - Continue eliquis for stroke ppx - Will start low dose carvedilol for management of #3.  2. Hyperkalemia: K 5.6 on las with  PCP yesterday. Recent admission for nausea felt to be 2/2 dehydration. EKG without arrhythmia - Will repeat BMET  - We discussed foods rich in potassium so as to limit dietary K over the next few days  3. HTN: BP elevated today. He reports BP primarily in the 140s/80s over the past several years.  - Will start low does carvedilol for BP control.   4. CKD stage 3: Cr bumped to 1.75 11/05/2018, also with hyperkalemia.  - Will repeat BMET - Referral to nephrology for CKD.     Current medicines are reviewed at length with the patient today.  The patient does not have concerns regarding medicines.  The following changes have been made:  Carvedilol 6.25mg  BID  Labs/ tests ordered today include:   Orders Placed This Encounter  Procedures   Basic Metabolic Panel (BMET)   Ambulatory referral to Nephrology   Amb ref to Medical Nutrition Therapy-MNT   EKG 12-Lead     Disposition:   FU with Dr. Martinique in 2 weeks via telemedicine visit.   Signed, Abigail Butts, PA-C  11/13/2018 8:27 PM

## 2018-11-11 NOTE — Telephone Encounter (Signed)
Returned call to patient's wife.Dr.Jordan's advice given.She stated husband does not a fever today 97.4. Stated ED told them he was dehydrated and follow up with PCP.Stated he saw PCP yesterday and she was concerned a high potassium could cause a heart arrhythmia.Stated when husband had afib he was not aware. Stated she would like him to be seen to have a ekg and repeat potassium.Appointment scheduled with Fabian Sharp PA today at 3:45 pm.

## 2018-11-11 NOTE — Telephone Encounter (Signed)
Spoke with patient's wife. Patient was in ER on Saturday - per wife, at home, his HR was in the 90s, O2 88-94%, temp of 101.4. She reports he was COVID negative and was dehydrated per ED assessment. His K+ was elevated at 5.3 in ED and at PCP yesterday was 5.6. PCP recommended he go to ED last night for labs & EKG but patient refused. Wife states PCP was availability to do labs today but not EKG. She is calling here to ask what they should do.   She reports that her husband does not disclose complaints often and has none at present but she is concerned b/c when he had aflutter he had no reported symptoms then.   She reports he has had about a month long bout of gout for which he has taken 2 courses of prednisone and is now on allopurinol 300mg  daily for.   She reviewed their diet for K+ rich foods and does not feel they consume any in excess.   Message routed to MD to advise per wife's request

## 2018-11-11 NOTE — Telephone Encounter (Signed)
I am more concerned about his fever. His elevated HR is appropriate for his fever and is still in the normal range. Should have his fever assessed by primary care.   Jamilex Bohnsack Martinique MD, [facility]

## 2018-11-11 NOTE — Patient Instructions (Addendum)
Medication Instructions:  START Coreg 6.25mg  take 1 tablet once a day  If you need a refill on your cardiac medications before your next appointment, please call your pharmacy.   Lab work: Your physician recommends that you return for lab work in: TODAY-BMET If you have labs (blood work) drawn today and your tests are completely normal, you will receive your results only by: Marland Kitchen MyChart Message (if you have MyChart) OR . A paper copy in the mail If you have any lab test that is abnormal or we need to change your treatment, we will call you to review the results.  Testing/Procedures: NONE   Follow-Up: At [facility], you and your health needs are our priority.  As part of our continuing mission to provide you with exceptional heart care, we have created designated Provider Care Teams.  These Care Teams include your primary Cardiologist (physician) and Advanced Practice Providers (APPs -  Physician Assistants and Nurse Practitioners) who all work together to provide you with the care you need, when you need it. You will need a follow up appointment in 2 weeks VIRTUAL VISIT.  Please call our office 2 months in advance to schedule this appointment.  You may see Peter Martinique, MD or one of the following Advanced Practice Providers on your designated Care Team: [place], [place] . Fabian Sharp, PA-C  Any Other Special Instructions Will Be Listed Below (If Applicable). KEEP A BLOOD PRESSURE LOG CHECK YOUR BLOOD PRESSURE ONCE A DAY  REFERRAL PLACED TO NEPHROLOGY AND NUTRITION

## 2018-11-11 NOTE — Telephone Encounter (Signed)
New message:    Patient wife calling concering her husband and states he went to the ER on Sunday they  Told him his potasium was 5.3 and went to a follow up yesterday and it was 5.6. the Primary care told him he need to see a doctor soon as possible and also concern about some medication. Please call patient wife.

## 2018-11-12 LAB — BASIC METABOLIC PANEL
BUN/Creatinine Ratio: 11 (ref 10–24)
BUN: 15 mg/dL (ref 8–27)
CO2: 23 mmol/L (ref 20–29)
Calcium: 9.6 mg/dL (ref 8.6–10.2)
Chloride: 98 mmol/L (ref 96–106)
Creatinine, Ser: 1.41 mg/dL — ABNORMAL HIGH (ref 0.76–1.27)
GFR calc Af Amer: 54 mL/min/{1.73_m2} — ABNORMAL LOW (ref >59)
GFR calc non Af Amer: 47 mL/min/{1.73_m2} — ABNORMAL LOW (ref >59)
Glucose: 108 mg/dL — ABNORMAL HIGH (ref 65–99)
Potassium: 4.7 mmol/L (ref 3.5–5.2)
Sodium: 136 mmol/L (ref 134–144)

## 2018-11-14 ENCOUNTER — Telehealth: Payer: Self-pay | Admitting: Licensed Clinical Social Worker

## 2018-11-14 NOTE — Telephone Encounter (Signed)
CSW referred to assist patient with obtaining a BP cuff and information on nutrition. CSW contacted patient to inform cuff will be delivered to home on Wednesday. CSW also provided the contact information for [facility].  Patient grateful for support and assistance. CSW available as needed. Raquel Sarna, [place], [place]

## 2018-11-15 ENCOUNTER — Telehealth: Payer: Self-pay

## 2018-11-15 NOTE — Telephone Encounter (Signed)
Called patient to give lab results and he wanted to know the status of his kidney and nutrition referral. Informed patient that I would speak with the check-out ladies and get back to him.  Found out that pt would need to contact the nutrition on his own and schedule his new pt appt. For the kidney referral we use [employer]. This office requires that we send over all his records first and the doctor will review and determine if the pt needs an appt. Pt stated he would have his PCP send over records to the kidney doctor as well to see if that would help speed up the process.  Gave patient name, number and address for nutritionist and Kidney specialist. Patent voiced understanding and thanked me for calling.

## 2018-11-24 ENCOUNTER — Other Ambulatory Visit: Payer: Self-pay | Admitting: Internal Medicine

## 2018-11-24 DIAGNOSIS — N183 Chronic kidney disease, stage 3 unspecified: Secondary | ICD-10-CM

## 2018-11-27 NOTE — Progress Notes (Signed)
Cardiology Office Note   Date:  11/29/2018   ID:  Robert Summers 10-20-1940, MRN [id number]  PCP:  Manfred Shirts, PA  Cardiologist:  Peter Martinique, MD EP: None  Chief Complaint  Patient presents with  . Atrial Fibrillation      History of Present Illness: Robert Summers is a 78 y.o. male with PMH of paroxysmal atrial flutter s/p successful DCCV 07/2018, pre-DM type 2, GERD, and CKD stage 3 who presents for follow-up of his paroxysmal atrial flutter and HTN.  He was evaluated by cardiology via a telemedicine visit with Almyra Deforest, PA-C 10/11/2018, at which time he was without cardiac complaints and no medication changes occurred. Echo in March 2020 was grossly normal.   He reported recent ED visit 11/05/2018 for nausea which occurred after working in the heat all day. He attributed this to dehydration. During that ED visit he was noted to have Cr elevated to 1.75 (baseline 1.6) and mild hyperkalemia. He was discharged home with close follow-up with his PCP. He reported having blood work done yesterday with K 5.6. His PCP suggested he follow-up with cardiology to r/o recurrent arrhythmia. He was seen in follow up by [name] PA-C on 11/11/18 and was doing well except for elevated BP. Started on Coreg for BP. Initially started at 6.25 mg bid. This was reduced to 3.125 mg bid. Since then his BP has been well controlled. Pulse 58-68 at home. Feels well. He has no epigastric discomfort or SOB similar to his initial atrial flutter presentation.     Past Medical History:  Diagnosis Date  . AKI (acute kidney injury) ([place]) 08/10/2018  . Arthritis    KNEES  . Atrial flutter ([place]) 08/10/2018  . Atrial flutter with rapid ventricular response ([place]) 08/09/2018  . Chest pain 08/09/2018  . GERD (gastroesophageal reflux disease)    OCCASIONAL ONLY  . H/O hiatal hernia   . History of gout   . History of nonmelanoma skin cancer   . Malignant neoplasm of prostate ([place]) 07/05/2013  .  Prediabetes   . Prostate cancer [facility])     Past Surgical History:  Procedure Laterality Date  . CARDIOVERSION N/A 08/11/2018   Procedure: CARDIOVERSION;  Surgeon: Buford Dresser, MD;  Location: [facility];  Service: Cardiovascular;  Laterality: N/A;  . CIRCUMCISION    . ROBOT ASSISTED LAPAROSCOPIC RADICAL PROSTATECTOMY N/A 07/05/2013   Procedure: ROBOTIC ASSISTED LAPAROSCOPIC NERVE SPARING RADICAL PROSTATECTOMY;  Surgeon: Irine Seal, MD;  Location[facility];  Service: Urology;  Laterality: N/A;  . SPERMATACELE     REPAIRED  . WRIST SURGERY     RT     Current Outpatient Medications  Medication Sig Dispense Refill  . apixaban (ELIQUIS) 5 MG TABS tablet Take 1 tablet (5 mg total) by mouth 2 (two) times daily. 180 tablet 2  . carvedilol (COREG) 6.25 MG tablet Take 1 tablet (6.25 mg total) by mouth 2 (two) times daily. 180 tablet 3  . cholecalciferol (VITAMIN D) 25 MCG (1000 UT) tablet Take 1,000 Units by mouth daily.    Marland Kitchen MAGNESIUM CARBONATE PO Take 1 capsule by mouth daily.    . Omega-3 Fatty Acids (FISH OIL) 1000 MG CAPS Take 1 capsule by mouth daily.    Marland Kitchen allopurinol (ZYLOPRIM) 100 MG tablet Take 100 mg by mouth 2 (two) times daily.    . Multiple Vitamin (MULTIVITAMIN WITH MINERALS) TABS tablet Take 1 tablet by mouth daily.    . predniSONE (DELTASONE) 10 MG  tablet Take 10 mg by mouth daily.     No current facility-administered medications for this visit.     Allergies:   Lisinopril, Nsaids, and Latex    Social History:  The patient  reports that he has quit smoking. He quit smokeless tobacco use about 56 years ago. He reports current alcohol use. He reports that he does not use drugs.   Family History:  The patient's family history includes AAA (abdominal aortic aneurysm) in his brother and sister; Arthritis in his father; CAD in his brother and father; Diabetes in his brother, brother, and mother; Hypertension in his brother and sister; Leukemia in his brother; Stroke in his  mother.    ROS:  Please see the history of present illness.   Otherwise, review of systems are positive for none.   All other systems are reviewed and negative.    PHYSICAL EXAM: VS:  BP (!) 142/78   Pulse (!) 54   Temp 97.8 F (36.6 C)   Ht 5\' 10"  (1.778 m)   Wt 223 lb 9.6 oz (101.4 kg)   SpO2 95%   BMI 32.08 kg/m  , BMI Body mass index is 32.08 kg/m. GEN: Well nourished, well developed, in no acute distress HEENT: sclera anicteric Neck: no JVD, carotid bruits, or masses Cardiac: RRR; no murmurs, rubs, or gallops, trace LE edema  Respiratory:  clear to auscultation bilaterally, normal work of breathing GI: soft, nontender, nondistended, + BS MS: no deformity or atrophy Skin: warm and dry, no rash Neuro:  Strength and sensation are intact Psych: euthymic mood, full affect   EKG:  EKG is ordered today. The ekg ordered today demonstrates EKG Sinus rhythm rate 43. Otherwise normal. I have personally reviewed and interpreted this study.    Recent Labs: 08/09/2018: Magnesium 2.1; TSH 2.793 11/05/2018: ALT 17; Hemoglobin 15.6; Platelets 204 11/11/2018: BUN 15; Creatinine, Ser 1.41; Potassium 4.7; Sodium 136    Lipid Panel No results found for: CHOL, TRIG, HDL, CHOLHDL, VLDL, LDLCALC, LDLDIRECT  Dated 06/08/16: cholesterol 175, triglycerides 216, HDL 36, LDL 96. A1c 6.8%  Wt Readings from Last 3 Encounters:  11/29/18 223 lb 9.6 oz (101.4 kg)  11/11/18 220 lb 12.8 oz (100.2 kg)  10/11/18 225 lb (102.1 kg)      Other studies Reviewed: Additional studies/ records that were reviewed today include:   Echocardiogram 07/2018: IMPRESSIONS    1. The left ventricle has normal systolic function, with an ejection fraction of 55-60%. The cavity size was normal. Left ventricular diastolic function could not be evaluated due to nondiagnostic images.  2. Extremely limited views without use of contrast. Definity (echo contrast) shows grossly normal function, no clear wall motion  abnormalities. Anteroseptum appears to have an area that appears thinner at rest, but it does contract.  3. The right ventricle has normal systolic function. The cavity was normal. There is no increase in right ventricular wall thickness. Right ventricular systolic pressure could not be assessed.  4. Left atrial size was not well visualized.  5. The mitral valve is grossly normal.  6. The tricuspid valve is grossly normal.  7. The aortic valve is tricuspid Mild calcification of the aortic valve. Aortic valve regurgitation was not assessed by color flow Doppler.  SUMMARY   Extremely limited windows without use of echo contrast. With contrast, grossly normal LV function, no apparent WMA or significant valve disease. In atrial flutter throughout study.    ASSESSMENT AND PLAN:  1. Paroxysmal atrial flutter: s/p cardioversion 07/2018.  No recurrent symptoms c/w aflutter presentation. EKG today with sinus rhythm. No complaints of bleeding. - Continue eliquis  - on  low dose carvedilol for management of #3.  2. Hyperkalemia: resolved. Probably related to dehydration  3. HTN: BP well controlled on low dose Coreg.   4. CKD stage 3: stable. Seen by Nephrology.    Current medicines are reviewed at length with the patient today.  The patient does not have concerns regarding medicines.  The following changes have been made:  none  Labs/ tests ordered today include:   Orders Placed This Encounter  Procedures  . EKG 12-Lead     Disposition:   FU 6 months.  Signed, Peter Martinique, MD  11/29/2018 9:09 AM

## 2018-11-28 ENCOUNTER — Telehealth: Payer: Self-pay | Admitting: Cardiology

## 2018-11-28 NOTE — Telephone Encounter (Signed)
° ° °  COVID-19 Pre-Screening Questions:   In the past 7 to 10 days have you had a cough,  shortness of breath, headache, congestion, fever (100 or greater) body aches, chills, sore throat, or sudden loss of taste or sense of smell? No  Have you been around anyone with known Covid 19. No  Have you been around anyone who is awaiting Covid 19 test results in the past 7 to 10 days? nNoave you been around anyone who has been exposed to Covid 19, or has mentioned symptoms of Covid 19 within the past 7 to 10 days? No  If you have any concerns/questions about symptoms patients report during screening (either on the phone or at threshold). Contact the provider seeing the patient or DOD for further guidance.  If neither are available contact a member of the leadership team.

## 2018-11-29 ENCOUNTER — Ambulatory Visit (INDEPENDENT_AMBULATORY_CARE_PROVIDER_SITE_OTHER): Payer: Medicare Other | Admitting: Cardiology

## 2018-11-29 ENCOUNTER — Encounter: Payer: Self-pay | Admitting: Cardiology

## 2018-11-29 ENCOUNTER — Other Ambulatory Visit: Payer: Self-pay

## 2018-11-29 VITALS — BP 142/78 | HR 54 | Temp 97.8°F | Ht 70.0 in | Wt 223.6 lb

## 2018-11-29 DIAGNOSIS — N183 Chronic kidney disease, stage 3 unspecified: Secondary | ICD-10-CM

## 2018-11-29 DIAGNOSIS — I480 Paroxysmal atrial fibrillation: Secondary | ICD-10-CM | POA: Diagnosis not present

## 2018-11-29 DIAGNOSIS — I10 Essential (primary) hypertension: Secondary | ICD-10-CM

## 2018-11-29 DIAGNOSIS — E875 Hyperkalemia: Secondary | ICD-10-CM | POA: Diagnosis not present

## 2018-11-29 NOTE — Patient Instructions (Signed)
Continue your current therapy  I will see you in 6 months.   

## 2018-11-29 NOTE — Telephone Encounter (Signed)
Opened in error

## 2018-12-07 ENCOUNTER — Ambulatory Visit
Admission: RE | Admit: 2018-12-07 | Discharge: 2018-12-07 | Disposition: A | Discharge status: Home / Self Care | Payer: Medicare Other | Source: Ambulatory Visit | Attending: Internal Medicine | Admitting: Internal Medicine

## 2018-12-07 DIAGNOSIS — N183 Chronic kidney disease, stage 3 unspecified: Secondary | ICD-10-CM

## 2018-12-20 ENCOUNTER — Telehealth: Payer: Self-pay | Admitting: Medical

## 2018-12-20 ENCOUNTER — Encounter: Payer: Medicare Other | Attending: Medical | Admitting: *Deleted

## 2018-12-20 ENCOUNTER — Other Ambulatory Visit: Payer: Self-pay

## 2018-12-20 DIAGNOSIS — N183 Chronic kidney disease, stage 3 unspecified: Secondary | ICD-10-CM

## 2018-12-20 DIAGNOSIS — E875 Hyperkalemia: Secondary | ICD-10-CM | POA: Insufficient documentation

## 2018-12-20 NOTE — Progress Notes (Signed)
Medical Nutrition Therapy:  Appt start time: 1400 end time:  1500.  Assessment:  Primary concerns today: He is interested in knowing about foods that have large amounts of potassium in then. He is aware of kidney disease and also of some elevated blood sugars in the past. But he states he keeps an eye on his A1c through his PCP who is in [facility], so is not interested in discussing that today.    Preferred Learning Style:   No preference indicated   Learning Readiness:   Contemplating  MEDICATIONS: see list   DIETARY INTAKE:  Usual eating pattern includes 3 meals and 0-1 snacks per day.  Everyday foods include good variety of all food groups.  Avoided foods include bananas due to potassium except occasionally in banana pudding.  His wife prepares most of their meals and he provided a detailed diet history.  24-hr recall:  B ( AM): cereal (Cheerios, honey nut) with almond milk OR cup of yogurt Snk ( AM): not usually  L ( PM): homemade soup with lots of vegetables and [place] OR chili with [place] meat, kidney beans, stewed tomatoes and tomato juice, occasionally with croutons or crackers or small bag of chips or PNB or pimento cheese crackers OR 1/2 sandwich with broccoli soup at [employer], occasionally with oatmeal raisin cookie Snk ( PM): occasionally a dessert like banana pudding D ( PM): leftover soup OR other leftovers from lunch Snk ( PM): none Beverages: coffee with International milk and 1 tsp honey and coconut oil a couple cups per week, bottled water all day,   Usual physical activity: less active due to Covid pandemic, stays home a lot now. He enjoys gardening daily even if just picking up twigs that have fallen from the trees overnight.   Estimated energy needs: 1600 calories 220 g carbohydrates 60 g protein 33 g fat  Progress Towards Goal(s):  In progress.   Nutritional Diagnosis:  NB-1.1 Food and nutrition-related knowledge deficit As related to hyperkalemia.  As  evidenced by elevated potassium level with renal failure.    Intervention:  Nutrition counseling for potassium restriction initiated. Discussed Potassium content of foods by food group as method of portion control, reading food labels, and benefits of increased activity. . .  Teaching Method Utilized: Visual, Auditory Hands on  Handouts given during visit include:  Potassium Content of Foods  Barriers to learning/adherence to lifestyle change: none  Demonstrated degree of understanding via:  Teach Back   Monitoring/Evaluation:  Dietary intake, exercise, reading food labels, and body weight prn.

## 2018-12-20 NOTE — Telephone Encounter (Signed)
Called [facility] and patient was seen at their office on 11-17-18.

## 2018-12-22 NOTE — Patient Instructions (Signed)
Plan: We discussed which of the foods you typically eat that are moderately or high in potassium.  You have a list of the potassium content of most foods  I have suggested you limit the High Potassium foods to occasional use, choose from the Moderate list typically and enjoy the Low Potassium foods more often.

## 2019-01-13 ENCOUNTER — Encounter

## 2019-05-16 ENCOUNTER — Other Ambulatory Visit: Payer: Self-pay | Admitting: Cardiology

## 2019-05-16 NOTE — Telephone Encounter (Signed)
Refill Request.  

## 2019-05-17 NOTE — Progress Notes (Signed)
Cardiology Office Note   Date:  05/19/2019   ID:  Robert Summers, DOB [birth year], MRN [id number]  PCP:  Manfred Shirts, PA  Cardiologist:  Peter Martinique, MD EP: None  Chief Complaint  Patient presents with  . Atrial Flutter      History of Present Illness: Robert Summers is a 78 y.o. male with PMH of paroxysmal atrial flutter s/p successful DCCV 07/2018, pre-DM type 2, GERD, and CKD stage 3 who presents for follow-up of his paroxysmal atrial flutter and HTN.  He was evaluated by cardiology via a telemedicine visit with Almyra Deforest, PA-C 10/11/2018, at which time he was without cardiac complaints and no medication changes occurred. Echo in March 2020 was grossly normal.   He reported recent ED visit 11/05/2018 for nausea which occurred after working in the heat all day. He attributed this to dehydration. During that ED visit he was noted to have Cr elevated to 1.75 (baseline 1.6) and mild hyperkalemia. He was discharged home with close follow-up with his PCP. He reported having blood work done yesterday with K 5.6. His PCP suggested he follow-up with cardiology to r/o recurrent arrhythmia. He was seen in follow up by [name] PA-C on 11/11/18 and was doing well except for elevated BP. Started on Coreg for BP. Initially started at 6.25 mg bid. This was reduced to 3.125 mg bid. Since then his BP has been well controlled. Pulse 58-68 at home. Feels well. He has no epigastric discomfort or SOB similar to his initial atrial flutter presentation.   On follow up he is doing very well. No recurrent arrhythmia. Has an Apple watch to check on it. No dizziness. BP is doing very well. He has gained about 20 lbs due to inactivity and eating more during the pandemic.    Past Medical History:  Diagnosis Date  . AKI (acute kidney injury) ([place]) 08/10/2018  . Arthritis    KNEES  . Atrial flutter ([place]) 08/10/2018  . Atrial flutter with rapid ventricular response ([place]) 08/09/2018  . Chest pain  08/09/2018  . GERD (gastroesophageal reflux disease)    OCCASIONAL ONLY  . H/O hiatal hernia   . History of gout   . History of nonmelanoma skin cancer   . Malignant neoplasm of prostate ([place]) 07/05/2013  . Prediabetes   . Prostate cancer [facility])     Past Surgical History:  Procedure Laterality Date  . CARDIOVERSION N/A 08/11/2018   Procedure: CARDIOVERSION;  Surgeon: Buford Dresser, MD;  Location: [facility] ENDOSCOPY;  Service: Cardiovascular;  Laterality: N/A;  . CIRCUMCISION    . ROBOT ASSISTED LAPAROSCOPIC RADICAL PROSTATECTOMY N/A 07/05/2013   Procedure: ROBOTIC ASSISTED LAPAROSCOPIC NERVE SPARING RADICAL PROSTATECTOMY;  Surgeon: Irine Seal, MD;  Location: [facility] ORS;  Service: Urology;  Laterality: N/A;  . SPERMATACELE     REPAIRED  . WRIST SURGERY     RT     Current Outpatient Medications  Medication Sig Dispense Refill  . allopurinol (ZYLOPRIM) 100 MG tablet Take 100 mg by mouth 2 (two) times daily.    . carvedilol (COREG) 6.25 MG tablet Take 1 tablet (6.25 mg total) by mouth 2 (two) times daily. 180 tablet 3  . cholecalciferol (VITAMIN D) 25 MCG (1000 UT) tablet Take 1,000 Units by mouth daily.    Marland Kitchen ELIQUIS 5 MG TABS tablet TAKE 1 TABLET BY MOUTH TWICE A DAY 180 tablet 2  . MAGNESIUM CARBONATE PO Take 1 capsule by mouth daily.    . Omega-3 Fatty  Acids (FISH OIL) 1000 MG CAPS Take 1 capsule by mouth daily.     No current facility-administered medications for this visit.    Allergies:   Lisinopril, Nsaids, and Latex    Social History:  The patient  reports that he has quit smoking. He quit smokeless tobacco use about 56 years ago. He reports current alcohol use. He reports that he does not use drugs.   Family History:  The patient's family history includes AAA (abdominal aortic aneurysm) in his brother and sister; Arthritis in his father; CAD in his brother and father; Diabetes in his brother, brother, and mother; Hypertension in his brother and sister; Leukemia in his brother;  Stroke in his mother.    ROS:  Please see the history of present illness.   Otherwise, review of systems are positive for none.   All other systems are reviewed and negative.    PHYSICAL EXAM: VS:  BP 132/80   Pulse 61   Ht 5' 9.5" (1.765 m)   Wt 245 lb (111.1 kg)   SpO2 95%   BMI 35.66 kg/m  , BMI Body mass index is 35.66 kg/m. GEN: Well nourished, well developed, in no acute distress HEENT: sclera anicteric Neck: no JVD, carotid bruits, or masses Cardiac: RRR; no murmurs, rubs, or gallops, trace LE edema  Respiratory:  clear to auscultation bilaterally, normal work of breathing GI: soft, nontender, nondistended, + BS MS: no deformity or atrophy Skin: warm and dry, no rash Neuro:  Strength and sensation are intact Psych: euthymic mood, full affect   EKG:  EKG is not ordered today.   Recent Labs: 08/09/2018: Magnesium 2.1; TSH 2.793 11/05/2018: ALT 17; Hemoglobin 15.6; Platelets 204 11/11/2018: BUN 15; Creatinine, Ser 1.41; Potassium 4.7; Sodium 136    Lipid Panel No results found for: CHOL, TRIG, HDL, CHOLHDL, VLDL, LDLCALC, LDLDIRECT  Dated 06/08/16: cholesterol 175, triglycerides 216, HDL 36, LDL 96. A1c 6.8%  Wt Readings from Last 3 Encounters:  05/19/19 245 lb (111.1 kg)  11/29/18 223 lb 9.6 oz (101.4 kg)  11/11/18 220 lb 12.8 oz (100.2 kg)      Other studies Reviewed: Additional studies/ records that were reviewed today include:   Echocardiogram 07/2018: IMPRESSIONS    1. The left ventricle has normal systolic function, with an ejection fraction of 55-60%. The cavity size was normal. Left ventricular diastolic function could not be evaluated due to nondiagnostic images.  2. Extremely limited views without use of contrast. Definity (echo contrast) shows grossly normal function, no clear wall motion abnormalities. Anteroseptum appears to have an area that appears thinner at rest, but it does contract.  3. The right ventricle has normal systolic function. The  cavity was normal. There is no increase in right ventricular wall thickness. Right ventricular systolic pressure could not be assessed.  4. Left atrial size was not well visualized.  5. The mitral valve is grossly normal.  6. The tricuspid valve is grossly normal.  7. The aortic valve is tricuspid Mild calcification of the aortic valve. Aortic valve regurgitation was not assessed by color flow Doppler.  SUMMARY   Extremely limited windows without use of echo contrast. With contrast, grossly normal LV function, no apparent WMA or significant valve disease. In atrial flutter throughout study.    ASSESSMENT AND PLAN:  1. Paroxysmal atrial flutter: s/p cardioversion 07/2018. No recurrent symptoms c/w aflutter presentation. Pulse is regular today.  No complaints of bleeding. - Continue eliquis  - on  low dose carvedilol for management  of #3.  2. Hyperkalemia: resolved. Probably related to dehydration  3. HTN: BP well controlled on low dose Coreg.   4. CKD stage 3: stable. Seen by Nephrology.    Current medicines are reviewed at length with the patient today.  The patient does not have concerns regarding medicines.  The following changes have been made:  none  Labs/ tests ordered today include:   No orders of the defined types were placed in this encounter.    Disposition:   FU 6 months.  Signed, Peter Martinique, MD  05/19/2019 10:00 AM

## 2019-05-19 ENCOUNTER — Ambulatory Visit (INDEPENDENT_AMBULATORY_CARE_PROVIDER_SITE_OTHER): Payer: Medicare Other | Admitting: Cardiology

## 2019-05-19 ENCOUNTER — Other Ambulatory Visit: Payer: Self-pay

## 2019-05-19 ENCOUNTER — Encounter: Payer: Self-pay | Admitting: Cardiology

## 2019-05-19 VITALS — BP 132/80 | HR 61 | Ht 69.5 in | Wt 245.0 lb

## 2019-05-19 DIAGNOSIS — I480 Paroxysmal atrial fibrillation: Secondary | ICD-10-CM | POA: Diagnosis not present

## 2019-05-19 DIAGNOSIS — I10 Essential (primary) hypertension: Secondary | ICD-10-CM | POA: Diagnosis not present

## 2019-07-14 ENCOUNTER — Ambulatory Visit: Payer: Medicare Other

## 2019-11-10 NOTE — Progress Notes (Signed)
Cardiology Office Note   Date:  11/13/2019   ID:  Robert Summers, DOB 1940/11/01, MRN [id number]  PCP:  Manfred Shirts, PA  Cardiologist:  [place] Noori Martinique, MD EP: None  Chief Complaint  Patient presents with   Atrial Flutter      History of Present Illness: Robert Summers is a 79 y.o. male with PMH of paroxysmal atrial flutter s/p successful DCCV 07/2018, pre-DM type 2, GERD, and CKD stage 3 who presents for follow-up of his paroxysmal atrial flutter and HTN.  He was evaluated by cardiology via a telemedicine visit with Almyra Deforest, PA-C 10/11/2018, at which time he was without cardiac complaints and no medication changes occurred. Echo in March 2020 was grossly normal.    He was seen in follow up by [name] PA-C on 11/11/18 and was doing well except for elevated BP. Started on Coreg for BP. Initially started at 6.25 mg bid. This was later reduced to 3.125 mg bid.   On follow up he is doing very well. No recurrent arrhythmia. Has an Apple watch to check on it. No dizziness. BP is doing very well. He has focused on his carbohydrate intake since he was diagnosed with DM and has lost 17 lbs.    Past Medical History:  Diagnosis Date   AKI (acute kidney injury) ([place]) 08/10/2018   Arthritis    KNEES   Atrial flutter ([place]) 08/10/2018   Atrial flutter with rapid ventricular response ([place]) 08/09/2018   Chest pain 08/09/2018   GERD (gastroesophageal reflux disease)    OCCASIONAL ONLY   H/O hiatal hernia    History of gout    History of nonmelanoma skin cancer    Malignant neoplasm of prostate ([place]) 07/05/2013   Prediabetes    Prostate cancer [facility])     Past Surgical History:  Procedure Laterality Date   CARDIOVERSION N/A 08/11/2018   Procedure: CARDIOVERSION;  Surgeon: Buford Dresser, MD;  Location: [place];  Service: Cardiovascular;  Laterality: N/A;   CIRCUMCISION     ROBOT ASSISTED LAPAROSCOPIC RADICAL PROSTATECTOMY N/A 07/05/2013   Procedure:  ROBOTIC ASSISTED LAPAROSCOPIC NERVE [place];  Surgeon: Irine Seal, MD;  Location: [facility] ORS;  Service: Urology;  Laterality: N/A;   SPERMATACELE     REPAIRED   WRIST SURGERY     RT     Current Outpatient Medications  Medication Sig Dispense Refill   allopurinol (ZYLOPRIM) 100 MG tablet Take 100 mg by mouth 2 (two) times daily.     carvedilol (COREG) 6.25 MG tablet Take 1 tablet (6.25 mg total) by mouth 2 (two) times daily. 180 tablet 3   cholecalciferol (VITAMIN D) 25 MCG (1000 UT) tablet Take 1,000 Units by mouth daily.     ELIQUIS 5 MG TABS tablet TAKE 1 TABLET BY MOUTH TWICE A DAY 180 tablet 2   MAGNESIUM CARBONATE PO Take 1 capsule by mouth daily.     metFORMIN (GLUCOPHAGE-XR) 500 MG 24 hr tablet Take 500 mg by mouth every morning.     Omega-3 Fatty Acids (FISH OIL) 1000 MG CAPS Take 1 capsule by mouth daily.     No current facility-administered medications for this visit.    Allergies:   Lisinopril, Nsaids, and Latex    Social History:  The patient  reports that he has quit smoking. He quit smokeless tobacco use about 57 years ago. He reports current alcohol use. He reports that he does not use drugs.   Family History:  The  patient's family history includes AAA (abdominal aortic aneurysm) in his brother and sister; Arthritis in his father; CAD in his brother and father; Diabetes in his brother, brother, and mother; Hypertension in his brother and sister; Leukemia in his brother; Stroke in his mother.    ROS:  Please see the history of present illness.   Otherwise, review of systems are positive for none.   All other systems are reviewed and negative.    PHYSICAL EXAM: VS:  BP 138/72    Pulse (!) 59    Ht 5\' 10"  (1.778 m)    Wt 228 lb (103.4 kg)    BMI 32.71 kg/m  , BMI Body mass index is 32.71 kg/m. GEN: Well nourished, well developed, in no acute distress HEENT: sclera anicteric Neck: no JVD, carotid bruits, or masses Cardiac: RRR; no murmurs,  rubs, or gallops, trace LE edema  Respiratory:  clear to auscultation bilaterally, normal work of breathing GI: soft, nontender, nondistended, + BS MS: no deformity or atrophy Skin: warm and dry, no rash Neuro:  Strength and sensation are intact Psych: euthymic mood, full affect   EKG:  EKG is ordered today. NSR rate 59. First degree AV block. I have personally reviewed and interpreted this study.    Recent Labs: No results found for requested labs within last 8760 hours.    Lipid Panel No results found for: CHOL, TRIG, HDL, CHOLHDL, VLDL, LDLCALC, LDLDIRECT  Dated 06/08/16: cholesterol 175, triglycerides 216, HDL 36, LDL 96. A1c 6.8% Dated 08/09/19: Normal CBC, uric acid, TSH. A1c 7.7%. glucose 147 otherwise CMET normal. Cholesterol 173, triglycerides 210, HDL 38, LDL 93.   Wt Readings from Last 3 Encounters:  11/13/19 228 lb (103.4 kg)  05/19/19 245 lb (111.1 kg)  11/29/18 223 lb 9.6 oz (101.4 kg)      Other studies Reviewed: Additional studies/ records that were reviewed today include:   Echocardiogram 07/2018: IMPRESSIONS    1. The left ventricle has normal systolic function, with an ejection fraction of 55-60%. The cavity size was normal. Left ventricular diastolic function could not be evaluated due to nondiagnostic images.  2. Extremely limited views without use of contrast. Definity (echo contrast) shows grossly normal function, no clear wall motion abnormalities. Anteroseptum appears to have an area that appears thinner at rest, but it does contract.  3. The right ventricle has normal systolic function. The cavity was normal. There is no increase in right ventricular wall thickness. Right ventricular systolic pressure could not be assessed.  4. Left atrial size was not well visualized.  5. The mitral valve is grossly normal.  6. The tricuspid valve is grossly normal.  7. The aortic valve is tricuspid Mild calcification of the aortic valve. Aortic valve regurgitation  was not assessed by color flow Doppler.  SUMMARY   Extremely limited windows without use of echo contrast. With contrast, grossly normal LV function, no apparent WMA or significant valve disease. In atrial flutter throughout study.    ASSESSMENT AND PLAN:  1. Paroxysmal atrial flutter: s/p cardioversion 07/2018. No recurrent symptoms c/w aflutter presentation. Maintaining NSR - Continue eliquis  - on  low dose carvedilol   2. HTN: BP well controlled on low dose Coreg.   3. CKD stage 3: stable. Seen by Nephrology.    Current medicines are reviewed at length with the patient today.  The patient does not have concerns regarding medicines.  The following changes have been made:  none  Labs/ tests ordered today include:  No orders of the defined types were placed in this encounter.    Disposition:   FU one year  Signed, Genevieve Ritzel Martinique, MD  11/13/2019 3:57 PM

## 2019-11-13 ENCOUNTER — Encounter: Payer: Self-pay | Admitting: Cardiology

## 2019-11-13 ENCOUNTER — Ambulatory Visit (INDEPENDENT_AMBULATORY_CARE_PROVIDER_SITE_OTHER): Payer: Medicare Other | Admitting: Cardiology

## 2019-11-13 ENCOUNTER — Other Ambulatory Visit: Payer: Self-pay

## 2019-11-13 VITALS — BP 138/72 | HR 59 | Ht 70.0 in | Wt 228.0 lb

## 2019-11-13 DIAGNOSIS — I480 Paroxysmal atrial fibrillation: Secondary | ICD-10-CM

## 2019-11-13 DIAGNOSIS — N1831 Chronic kidney disease, stage 3a: Secondary | ICD-10-CM

## 2019-11-13 DIAGNOSIS — I10 Essential (primary) hypertension: Secondary | ICD-10-CM

## 2019-11-13 NOTE — Patient Instructions (Signed)
d 

## 2019-12-25 ENCOUNTER — Other Ambulatory Visit: Payer: Self-pay

## 2019-12-25 ENCOUNTER — Inpatient Hospital Stay (HOSPITAL_BASED_OUTPATIENT_CLINIC_OR_DEPARTMENT_OTHER)
Admission: EM | Admit: 2019-12-25 | Discharge: 2019-12-27 | DRG: 194 | Disposition: A | Discharge status: Home / Self Care | Payer: Medicare Other | Attending: Family Medicine | Admitting: Family Medicine

## 2019-12-25 ENCOUNTER — Emergency Department (HOSPITAL_BASED_OUTPATIENT_CLINIC_OR_DEPARTMENT_OTHER): Payer: Medicare Other

## 2019-12-25 DIAGNOSIS — Z7901 Long term (current) use of anticoagulants: Secondary | ICD-10-CM

## 2019-12-25 DIAGNOSIS — Z886 Allergy status to analgesic agent status: Secondary | ICD-10-CM

## 2019-12-25 DIAGNOSIS — H9190 Unspecified hearing loss, unspecified ear: Secondary | ICD-10-CM | POA: Diagnosis present

## 2019-12-25 DIAGNOSIS — Z8261 Family history of arthritis: Secondary | ICD-10-CM

## 2019-12-25 DIAGNOSIS — Z888 Allergy status to other drugs, medicaments and biological substances status: Secondary | ICD-10-CM

## 2019-12-25 DIAGNOSIS — J189 Pneumonia, unspecified organism: Secondary | ICD-10-CM | POA: Diagnosis not present

## 2019-12-25 DIAGNOSIS — N183 Chronic kidney disease, stage 3 unspecified: Secondary | ICD-10-CM | POA: Diagnosis present

## 2019-12-25 DIAGNOSIS — E1151 Type 2 diabetes mellitus with diabetic peripheral angiopathy without gangrene: Secondary | ICD-10-CM | POA: Diagnosis present

## 2019-12-25 DIAGNOSIS — R0902 Hypoxemia: Secondary | ICD-10-CM | POA: Diagnosis present

## 2019-12-25 DIAGNOSIS — Z823 Family history of stroke: Secondary | ICD-10-CM

## 2019-12-25 DIAGNOSIS — Z806 Family history of leukemia: Secondary | ICD-10-CM

## 2019-12-25 DIAGNOSIS — Z85828 Personal history of other malignant neoplasm of skin: Secondary | ICD-10-CM

## 2019-12-25 DIAGNOSIS — Z20822 Contact with and (suspected) exposure to covid-19: Secondary | ICD-10-CM | POA: Diagnosis present

## 2019-12-25 DIAGNOSIS — Z91040 Latex allergy status: Secondary | ICD-10-CM

## 2019-12-25 DIAGNOSIS — E1122 Type 2 diabetes mellitus with diabetic chronic kidney disease: Secondary | ICD-10-CM | POA: Diagnosis present

## 2019-12-25 DIAGNOSIS — I2510 Atherosclerotic heart disease of native coronary artery without angina pectoris: Secondary | ICD-10-CM | POA: Diagnosis present

## 2019-12-25 DIAGNOSIS — Z833 Family history of diabetes mellitus: Secondary | ICD-10-CM

## 2019-12-25 DIAGNOSIS — Z87891 Personal history of nicotine dependence: Secondary | ICD-10-CM

## 2019-12-25 DIAGNOSIS — Z8546 Personal history of malignant neoplasm of prostate: Secondary | ICD-10-CM

## 2019-12-25 DIAGNOSIS — Z7984 Long term (current) use of oral hypoglycemic drugs: Secondary | ICD-10-CM

## 2019-12-25 DIAGNOSIS — K219 Gastro-esophageal reflux disease without esophagitis: Secondary | ICD-10-CM | POA: Diagnosis present

## 2019-12-25 DIAGNOSIS — R509 Fever, unspecified: Secondary | ICD-10-CM | POA: Diagnosis not present

## 2019-12-25 DIAGNOSIS — Z8249 Family history of ischemic heart disease and other diseases of the circulatory system: Secondary | ICD-10-CM

## 2019-12-25 DIAGNOSIS — I4892 Unspecified atrial flutter: Secondary | ICD-10-CM | POA: Diagnosis present

## 2019-12-25 DIAGNOSIS — M170 Bilateral primary osteoarthritis of knee: Secondary | ICD-10-CM | POA: Diagnosis present

## 2019-12-25 LAB — CBC WITH DIFFERENTIAL/PLATELET
Abs Immature Granulocytes: 0.07 10*3/uL (ref 0.00–0.07)
Basophils Absolute: 0 10*3/uL (ref 0.0–0.1)
Basophils Relative: 0 %
Eosinophils Absolute: 0 10*3/uL (ref 0.0–0.5)
Eosinophils Relative: 0 %
HCT: 45 % (ref 39.0–52.0)
Hemoglobin: 15.2 g/dL (ref 13.0–17.0)
Immature Granulocytes: 1 %
Lymphocytes Relative: 17 %
Lymphs Abs: 2.1 10*3/uL (ref 0.7–4.0)
MCH: 30.6 pg (ref 26.0–34.0)
MCHC: 33.8 g/dL (ref 30.0–36.0)
MCV: 90.7 fL (ref 80.0–100.0)
Monocytes Absolute: 1.1 10*3/uL — ABNORMAL HIGH (ref 0.1–1.0)
Monocytes Relative: 9 %
Neutro Abs: 8.9 10*3/uL — ABNORMAL HIGH (ref 1.7–7.7)
Neutrophils Relative %: 73 %
Platelets: 170 10*3/uL (ref 150–400)
RBC: 4.96 MIL/uL (ref 4.22–5.81)
RDW: 13.2 % (ref 11.5–15.5)
WBC: 12.3 10*3/uL — ABNORMAL HIGH (ref 4.0–10.5)
nRBC: 0 % (ref 0.0–0.2)

## 2019-12-25 LAB — COMPREHENSIVE METABOLIC PANEL
ALT: 17 U/L (ref 0–44)
AST: 20 U/L (ref 15–41)
Albumin: 3.5 g/dL (ref 3.5–5.0)
Alkaline Phosphatase: 51 U/L (ref 38–126)
Anion gap: 11 (ref 5–15)
BUN: 27 mg/dL — ABNORMAL HIGH (ref 8–23)
CO2: 23 mmol/L (ref 22–32)
Calcium: 8.9 mg/dL (ref 8.9–10.3)
Chloride: 97 mmol/L — ABNORMAL LOW (ref 98–111)
Creatinine, Ser: 1.43 mg/dL — ABNORMAL HIGH (ref 0.61–1.24)
GFR calc Af Amer: 53 mL/min — ABNORMAL LOW (ref >60)
GFR calc non Af Amer: 46 mL/min — ABNORMAL LOW (ref >60)
Glucose, Bld: 242 mg/dL — ABNORMAL HIGH (ref 70–99)
Potassium: 4.3 mmol/L (ref 3.5–5.1)
Sodium: 131 mmol/L — ABNORMAL LOW (ref 135–145)
Total Bilirubin: 1.2 mg/dL (ref 0.3–1.2)
Total Protein: 7.2 g/dL (ref 6.5–8.1)

## 2019-12-25 LAB — URINALYSIS, ROUTINE W REFLEX MICROSCOPIC
Glucose, UA: 250 mg/dL — AB
Ketones, ur: NEGATIVE mg/dL
Leukocytes,Ua: NEGATIVE
Nitrite: NEGATIVE
Protein, ur: >300 mg/dL — AB
Specific Gravity, Urine: >1.03 — ABNORMAL HIGH (ref 1.005–1.030)
pH: 5.5 (ref 5.0–8.0)

## 2019-12-25 LAB — URINALYSIS, MICROSCOPIC (REFLEX): Squamous Epithelial / HPF: NONE SEEN (ref 0–5)

## 2019-12-25 LAB — LACTIC ACID, PLASMA
Lactic Acid, Venous: 3.2 mmol/L (ref 0.5–1.9)
Lactic Acid, Venous: 1.5 mmol/L (ref 0.5–1.9)

## 2019-12-25 LAB — SARS CORONAVIRUS 2 BY RT PCR (HOSPITAL ORDER, PERFORMED IN ~~LOC~~ HOSPITAL LAB): SARS Coronavirus 2: NEGATIVE

## 2019-12-25 MED ORDER — SODIUM CHLORIDE 0.9% FLUSH
3.0000 mL | Freq: Once | INTRAVENOUS | Status: DC
  Filled 2019-12-25: qty 3

## 2019-12-25 MED ORDER — SODIUM CHLORIDE 0.9 % IV SOLN
1.0000 g | Freq: Once | INTRAVENOUS | Status: AC
  Administered 2019-12-25: 1 g via INTRAVENOUS
  Filled 2019-12-25: qty 10

## 2019-12-25 MED ORDER — SODIUM CHLORIDE 0.9 % IV BOLUS
1000.0000 mL | Freq: Once | INTRAVENOUS | Status: AC
  Administered 2019-12-25: 1000 mL via INTRAVENOUS

## 2019-12-25 MED ORDER — APIXABAN 5 MG PO TABS
5.0000 mg | ORAL_TABLET | Freq: Two times a day (BID) | ORAL | Status: DC
  Administered 2019-12-26 – 2019-12-27 (×3): 5 mg via ORAL
  Filled 2019-12-25 (×3): qty 1

## 2019-12-25 MED ORDER — SODIUM CHLORIDE 0.9 % IV SOLN
500.0000 mg | Freq: Once | INTRAVENOUS | Status: AC
  Administered 2019-12-25: 500 mg via INTRAVENOUS
  Filled 2019-12-25: qty 500

## 2019-12-25 MED ORDER — METFORMIN HCL ER 500 MG PO TB24
500.0000 mg | ORAL_TABLET | Freq: Every morning | ORAL | Status: DC
  Filled 2019-12-25: qty 1

## 2019-12-25 MED ORDER — CARVEDILOL 6.25 MG PO TABS
6.2500 mg | ORAL_TABLET | Freq: Two times a day (BID) | ORAL | Status: DC
  Administered 2019-12-26 – 2019-12-27 (×3): 6.25 mg via ORAL
  Filled 2019-12-25 (×3): qty 1

## 2019-12-25 MED ORDER — ACETAMINOPHEN 325 MG PO TABS
650.0000 mg | ORAL_TABLET | Freq: Once | ORAL | Status: AC
  Administered 2019-12-25: 650 mg via ORAL
  Filled 2019-12-25: qty 2

## 2019-12-25 NOTE — ED Provider Notes (Addendum)
[facility] EMERGENCY DEPARTMENT Provider Note   CSN: [id number] Arrival date & time: 12/25/19  1509     History Chief Complaint  Patient presents with  . Fever    Robert Summers is a 79 y.o. male.  Patient with history of atrial flutter on Eliquis, prostate cancer --presents the emergency department with fever.  Patient's wife contributes to history.  Patient was feeling normal yesterday.  He had difficulty sleeping last night and was less energetic this morning.  Wife checked the patient's temperature and it was 101.8 F.  His oxygen level was also low, 90% - 94%.  No significant cough or URI symptoms.  No nausea, vomiting, or diarrhea.  Urine was noted to be dark.  He had a video visit with a provider who ordered a rapid Covid and flu test as well as an x-ray.  Rapid flu and Covid test were negative.  I spoke with the PA by telephone who reports x-ray read as left sided infiltrate concerning for pneumonia.  No known sick contacts.  Onset of symptoms acute.  Course is constant.  CXR 12/25/2019 FINDINGS:  Stable cardiomediastinal contours. Suspect patchy opacity the  posterior aspect of the left lung base medially. Right lung appears  clear. No pleural effusion or pneumothorax.   IMPRESSION:  Suspect patchy opacity in the posterior aspect of the left lung  base, suspicious for pneumonia.           Past Medical History:  Diagnosis Date  . AKI (acute kidney injury) ([place]) 08/10/2018  . Arthritis    KNEES  . Atrial flutter ([place]) 08/10/2018  . Atrial flutter with rapid ventricular response ([place]) 08/09/2018  . Chest pain 08/09/2018  . GERD (gastroesophageal reflux disease)    OCCASIONAL ONLY  . H/O hiatal hernia   . History of gout   . History of nonmelanoma skin cancer   . Malignant neoplasm of prostate ([place]) 07/05/2013  . Prediabetes   . Prostate cancer [facility])     Patient Active Problem List   Diagnosis Date Noted  . AKI (acute kidney injury) ([place]) 08/10/2018    . Atrial flutter ([place]) 08/10/2018  . Atrial flutter with rapid ventricular response ([place]) 08/09/2018  . Chest pain 08/09/2018  . Malignant neoplasm of prostate ([place]) 07/05/2013    Past Surgical History:  Procedure Laterality Date  . CARDIOVERSION N/A 08/11/2018   Procedure: CARDIOVERSION;  Surgeon: Buford Dresser, MD;  Location: [facility] ENDOSCOPY;  Service: Cardiovascular;  Laterality: N/A;  . CIRCUMCISION    . ROBOT ASSISTED LAPAROSCOPIC RADICAL PROSTATECTOMY N/A 07/05/2013   Procedure: ROBOTIC ASSISTED LAPAROSCOPIC NERVE SPARING RADICAL PROSTATECTOMY;  Surgeon: Irine Seal, MD;  Location[facility] ORS;  Service: Urology;  Laterality: N/A;  . SPERMATACELE     REPAIRED  . WRIST SURGERY     RT       Family History  Problem Relation Age of Onset  . CAD Father        Died of MI at age 63  . Arthritis Father   . CAD Brother        died of presumed Mi at age 67  . Diabetes Brother   . Diabetes Mother   . Stroke Mother   . AAA (abdominal aortic aneurysm) Sister   . Hypertension Sister   . Leukemia Brother   . Diabetes Brother   . Hypertension Brother   . AAA (abdominal aortic aneurysm) Brother     Social History   Tobacco Use  . Smoking status:  Former Smoker  . Smokeless tobacco: Former Systems developer    Quit date: 06/28/1962  Vaping Use  . Vaping Use: Never used  Substance Use Topics  . Alcohol use: Yes    Comment: RARE  . Drug use: No    Home Medications Prior to Admission medications   Medication Sig Start Date End Date Taking? Authorizing Provider  allopurinol (ZYLOPRIM) 100 MG tablet Take 100 mg by mouth 2 (two) times daily. 11/11/18   [provider]  carvedilol (COREG) 6.25 MG tablet Take 1 tablet (6.25 mg total) by mouth 2 (two) times daily. 11/11/18   Kroeger, Lorelee Cover., PA-C  cholecalciferol (VITAMIN D) 25 MCG (1000 UT) tablet Take 1,000 Units by mouth daily.    [provider]  ELIQUIS 5 MG TABS tablet TAKE 1 TABLET BY MOUTH TWICE A DAY 05/16/19    Martinique, Peter M, MD  MAGNESIUM CARBONATE PO Take 1 capsule by mouth daily.    [provider]  metFORMIN (GLUCOPHAGE-XR) 500 MG 24 hr tablet Take 500 mg by mouth every morning. 11/05/19   [provider]  Omega-3 Fatty Acids (FISH OIL) 1000 MG CAPS Take 1 capsule by mouth daily.    [provider]    Allergies    Lisinopril, Nsaids, and Latex  Review of Systems   Review of Systems  Constitutional: Positive for chills, fatigue and fever.  HENT: Negative for rhinorrhea and sore throat.   Eyes: Negative for redness.  Respiratory: Negative for cough and shortness of breath.   Cardiovascular: Negative for chest pain.  Gastrointestinal: Negative for abdominal pain, diarrhea, nausea and vomiting.  Genitourinary: Negative for dysuria and hematuria.  Musculoskeletal: Negative for myalgias.  Skin: Negative for rash.  Neurological: Positive for weakness (Generalized). Negative for headaches.    Physical Exam Updated Vital Signs BP (!) 174/86 (BP Location: Right Arm)   Pulse 80   Temp 99.4 F (37.4 C) (Oral)   Resp (!) 24   Ht 5\' 10"  (1.778 m)   Wt (!) 103.4 kg   SpO2 95%   BMI 32.71 kg/m   Physical Exam Vitals and nursing note reviewed.  Constitutional:      Appearance: He is well-developed.     Comments: Warm to touch.  HENT:     Head: Normocephalic and atraumatic.     Right Ear: Tympanic membrane, ear canal and external ear normal.     Left Ear: Tympanic membrane, ear canal and external ear normal.     Nose: Nose normal.     Mouth/Throat:     Mouth: Mucous membranes are moist.  Eyes:     General:        Right eye: No discharge.        Left eye: No discharge.     Conjunctiva/sclera: Conjunctivae normal.  Cardiovascular:     Rate and Rhythm: Normal rate and regular rhythm.     Heart sounds: Normal heart sounds.  Pulmonary:     Effort: Pulmonary effort is normal. Tachypnea present.     Breath sounds: Normal breath sounds. No wheezing, rhonchi or  rales.  Abdominal:     Palpations: Abdomen is soft.     Tenderness: There is no abdominal tenderness. There is no guarding or rebound.  Musculoskeletal:     Cervical back: Normal range of motion and neck supple.  Skin:    General: Skin is warm and dry.  Neurological:     Mental Status: He is alert.     ED Results /  Procedures / Treatments   Labs (all labs ordered are listed, but only abnormal results are displayed) Labs Reviewed  LACTIC ACID, PLASMA - Abnormal; Notable for the following components:      Result Value   Lactic Acid, Venous 3.2 (*)    All other components within normal limits  COMPREHENSIVE METABOLIC PANEL - Abnormal; Notable for the following components:   Sodium 131 (*)    Chloride 97 (*)    Glucose, Bld 242 (*)    BUN 27 (*)    Creatinine, Ser 1.43 (*)    GFR calc non Af Amer 46 (*)    GFR calc Af Amer 53 (*)    All other components within normal limits  CBC WITH DIFFERENTIAL/PLATELET - Abnormal; Notable for the following components:   WBC 12.3 (*)    Neutro Abs 8.9 (*)    Monocytes Absolute 1.1 (*)    All other components within normal limits  URINALYSIS, ROUTINE W REFLEX MICROSCOPIC - Abnormal; Notable for the following components:   Color, Urine AMBER (*)    Specific Gravity, Urine >1.030 (*)    Glucose, UA 250 (*)    Hgb urine dipstick MODERATE (*)    Bilirubin Urine SMALL (*)    Protein, ur >300 (*)    All other components within normal limits  URINALYSIS, MICROSCOPIC (REFLEX) - Abnormal; Notable for the following components:   Bacteria, UA MANY (*)    All other components within normal limits  CULTURE, BLOOD (ROUTINE X 2)  CULTURE, BLOOD (ROUTINE X 2)  SARS CORONAVIRUS 2 BY RT PCR (HOSPITAL ORDER, [place] [facility])  LACTIC ACID, PLASMA    EKG None  Radiology No results found.  Procedures Procedures (including critical care time)  Medications Ordered in ED Medications  sodium chloride flush (NS) 0.9 % injection  3 mL (has no administration in time range)  acetaminophen (TYLENOL) tablet 650 mg (has no administration in time range)  sodium chloride 0.9 % bolus 1,000 mL (0 mLs Intravenous Stopped 12/25/19 1857)  cefTRIAXone (ROCEPHIN) 1 g in sodium chloride 0.9 % 100 mL IVPB (0 g Intravenous Stopped 12/25/19 1722)  azithromycin (ZITHROMAX) 500 mg in sodium chloride 0.9 % 250 mL IVPB (0 mg Intravenous Stopped 12/25/19 1828)    ED Course  I have reviewed the triage vital signs and the nursing notes.  Pertinent labs & imaging results that were available during my care of the patient were reviewed by me and considered in my medical decision making (see chart for details).  Patient seen and examined. Work-up initiated. Medications ordered.  Lactate elevated.  IV fluids ordered.  Will treat for pneumonia with Rocephin and azithromycin.  Will ambulate patient and check pulse ox.  Vital signs reviewed and are as follows: BP (!) 174/86 (BP Location: Right Arm)   Pulse 80   Temp 99.4 F (37.4 C) (Oral)   Resp (!) 24   Ht 5\' 10"  (1.778 m)   Wt (!) 103.4 kg   SpO2 95%   BMI 32.71 kg/m   7:30 PM Patient moderate CURB-65, still tachypneic, will admit. Pt/family agrees.   Dr. Florene Route admitting.     MDM Rules/Calculators/A&P                          CAP, fever, admit.   Final Clinical Impression(s) / ED Diagnoses Final diagnoses:  Community acquired pneumonia of left lung, unspecified part of lung    Rx /  DC Orders ED Discharge Orders    None          [place] Cater, Hershal Coria 12/25/19 Nino Parsley, MD 12/27/19 [phone]

## 2019-12-25 NOTE — ED Notes (Signed)
States rec Covid Vaccine, 2d vaccine rec in Feb 2021

## 2019-12-25 NOTE — ED Notes (Signed)
2ND LACTIC ACID OBTAINED

## 2019-12-25 NOTE — ED Notes (Signed)
COVID SWAB OBTAINED AND TO THE LAB 

## 2019-12-25 NOTE — ED Provider Notes (Signed)
Medical screening examination/treatment/procedure(s) were conducted as a shared visit with non-physician practitioner(s) and myself.  I personally evaluated the patient during the encounter.    Patient referred to the emergency department from PCP office for concerns of pneumonia with positive chest x-ray and fever of 101.8.  Patient has comorbid illness of atrial flutter on Eliquis.  Patient has had a loss of energy and difficulty sleeping overnight.  Oxygen levels had decreased to 90% to 94%.  Patient does not at baseline use oxygen.  Patient is alert.  He is tachypneic.  Heart regular.  Breath sounds grossly clear.  No calf tenderness or significant peripheral edema.  Patient does have fever, elevated lactic acidosis, leukocytosis and positive chest x-ray.  Findings concerning for SIRS\early sepsis.  Will initiate IV antibiotics, fever control and fluid resuscitation.. I agree with plan of management.   Charlesetta Shanks, MD 12/27/19 [phone]

## 2019-12-25 NOTE — ED Notes (Signed)
[facility] notified (Ruby) - patient ready for transport 

## 2019-12-25 NOTE — ED Triage Notes (Signed)
Fever, chills, fatigue, since yesterday. His Covid test this am was negative. He had a CXR this am. His urine is the color of mustard per wife.

## 2019-12-25 NOTE — ED Notes (Signed)
Ambulated in room, r/a SpO2 96-98%, HR 80s,  Compliant mild DOE after several laps around room.

## 2019-12-25 NOTE — ED Notes (Signed)
Blood cultures x 2 obtained and to the lab

## 2019-12-26 ENCOUNTER — Encounter (HOSPITAL_COMMUNITY): Payer: Self-pay | Admitting: Internal Medicine

## 2019-12-26 DIAGNOSIS — Z7901 Long term (current) use of anticoagulants: Secondary | ICD-10-CM | POA: Diagnosis not present

## 2019-12-26 DIAGNOSIS — E1151 Type 2 diabetes mellitus with diabetic peripheral angiopathy without gangrene: Secondary | ICD-10-CM | POA: Diagnosis present

## 2019-12-26 DIAGNOSIS — R0902 Hypoxemia: Secondary | ICD-10-CM | POA: Diagnosis present

## 2019-12-26 DIAGNOSIS — J189 Pneumonia, unspecified organism: Secondary | ICD-10-CM | POA: Diagnosis present

## 2019-12-26 DIAGNOSIS — I2510 Atherosclerotic heart disease of native coronary artery without angina pectoris: Secondary | ICD-10-CM | POA: Diagnosis present

## 2019-12-26 DIAGNOSIS — Z886 Allergy status to analgesic agent status: Secondary | ICD-10-CM | POA: Diagnosis not present

## 2019-12-26 DIAGNOSIS — Z833 Family history of diabetes mellitus: Secondary | ICD-10-CM | POA: Diagnosis not present

## 2019-12-26 DIAGNOSIS — R509 Fever, unspecified: Secondary | ICD-10-CM | POA: Diagnosis present

## 2019-12-26 DIAGNOSIS — Z20822 Contact with and (suspected) exposure to covid-19: Secondary | ICD-10-CM | POA: Diagnosis present

## 2019-12-26 DIAGNOSIS — Z85828 Personal history of other malignant neoplasm of skin: Secondary | ICD-10-CM | POA: Diagnosis not present

## 2019-12-26 DIAGNOSIS — Z87891 Personal history of nicotine dependence: Secondary | ICD-10-CM | POA: Diagnosis not present

## 2019-12-26 DIAGNOSIS — I4892 Unspecified atrial flutter: Secondary | ICD-10-CM | POA: Diagnosis present

## 2019-12-26 DIAGNOSIS — M170 Bilateral primary osteoarthritis of knee: Secondary | ICD-10-CM | POA: Diagnosis present

## 2019-12-26 DIAGNOSIS — H9190 Unspecified hearing loss, unspecified ear: Secondary | ICD-10-CM | POA: Diagnosis present

## 2019-12-26 DIAGNOSIS — Z8249 Family history of ischemic heart disease and other diseases of the circulatory system: Secondary | ICD-10-CM | POA: Diagnosis not present

## 2019-12-26 DIAGNOSIS — N183 Chronic kidney disease, stage 3 unspecified: Secondary | ICD-10-CM | POA: Diagnosis present

## 2019-12-26 DIAGNOSIS — Z91040 Latex allergy status: Secondary | ICD-10-CM | POA: Diagnosis not present

## 2019-12-26 DIAGNOSIS — Z888 Allergy status to other drugs, medicaments and biological substances status: Secondary | ICD-10-CM | POA: Diagnosis not present

## 2019-12-26 DIAGNOSIS — Z7984 Long term (current) use of oral hypoglycemic drugs: Secondary | ICD-10-CM | POA: Diagnosis not present

## 2019-12-26 DIAGNOSIS — Z8261 Family history of arthritis: Secondary | ICD-10-CM | POA: Diagnosis not present

## 2019-12-26 DIAGNOSIS — E1122 Type 2 diabetes mellitus with diabetic chronic kidney disease: Secondary | ICD-10-CM | POA: Diagnosis present

## 2019-12-26 DIAGNOSIS — Z823 Family history of stroke: Secondary | ICD-10-CM | POA: Diagnosis not present

## 2019-12-26 DIAGNOSIS — Z8546 Personal history of malignant neoplasm of prostate: Secondary | ICD-10-CM | POA: Diagnosis not present

## 2019-12-26 DIAGNOSIS — Z806 Family history of leukemia: Secondary | ICD-10-CM | POA: Diagnosis not present

## 2019-12-26 DIAGNOSIS — K219 Gastro-esophageal reflux disease without esophagitis: Secondary | ICD-10-CM | POA: Diagnosis present

## 2019-12-26 LAB — COMPREHENSIVE METABOLIC PANEL
ALT: 17 U/L (ref 0–44)
AST: 18 U/L (ref 15–41)
Albumin: 2.5 g/dL — ABNORMAL LOW (ref 3.5–5.0)
Alkaline Phosphatase: 41 U/L (ref 38–126)
Anion gap: 10 (ref 5–15)
BUN: 22 mg/dL (ref 8–23)
CO2: 21 mmol/L — ABNORMAL LOW (ref 22–32)
Calcium: 8.4 mg/dL — ABNORMAL LOW (ref 8.9–10.3)
Chloride: 102 mmol/L (ref 98–111)
Creatinine, Ser: 1.47 mg/dL — ABNORMAL HIGH (ref 0.61–1.24)
GFR calc Af Amer: 51 mL/min — ABNORMAL LOW (ref >60)
GFR calc non Af Amer: 44 mL/min — ABNORMAL LOW (ref >60)
Glucose, Bld: 137 mg/dL — ABNORMAL HIGH (ref 70–99)
Potassium: 3.9 mmol/L (ref 3.5–5.1)
Sodium: 133 mmol/L — ABNORMAL LOW (ref 135–145)
Total Bilirubin: 0.9 mg/dL (ref 0.3–1.2)
Total Protein: 5.8 g/dL — ABNORMAL LOW (ref 6.5–8.1)

## 2019-12-26 LAB — CBC WITH DIFFERENTIAL/PLATELET
Abs Immature Granulocytes: 0 10*3/uL (ref 0.00–0.07)
Basophils Absolute: 0 10*3/uL (ref 0.0–0.1)
Basophils Relative: 0 %
Eosinophils Absolute: 0 10*3/uL (ref 0.0–0.5)
Eosinophils Relative: 0 %
HCT: 41.4 % (ref 39.0–52.0)
Hemoglobin: 14.1 g/dL (ref 13.0–17.0)
Lymphocytes Relative: 19 %
Lymphs Abs: 1.9 10*3/uL (ref 0.7–4.0)
MCH: 30.8 pg (ref 26.0–34.0)
MCHC: 34.1 g/dL (ref 30.0–36.0)
MCV: 90.4 fL (ref 80.0–100.0)
Monocytes Absolute: 0.5 10*3/uL (ref 0.1–1.0)
Monocytes Relative: 5 %
Neutro Abs: 7.4 10*3/uL (ref 1.7–7.7)
Neutrophils Relative %: 76 %
Platelets: 164 10*3/uL (ref 150–400)
RBC: 4.58 MIL/uL (ref 4.22–5.81)
RDW: 13.2 % (ref 11.5–15.5)
WBC: 9.8 10*3/uL (ref 4.0–10.5)
nRBC: 0 % (ref 0.0–0.2)
nRBC: 0 /100 WBC

## 2019-12-26 LAB — STREP PNEUMONIAE URINARY ANTIGEN: Strep Pneumo Urinary Antigen: NEGATIVE

## 2019-12-26 LAB — GLUCOSE, CAPILLARY
Glucose-Capillary: 153 mg/dL — ABNORMAL HIGH (ref 70–99)
Glucose-Capillary: 159 mg/dL — ABNORMAL HIGH (ref 70–99)
Glucose-Capillary: 152 mg/dL — ABNORMAL HIGH (ref 70–99)
Glucose-Capillary: 137 mg/dL — ABNORMAL HIGH (ref 70–99)

## 2019-12-26 LAB — HEMOGLOBIN A1C
Hgb A1c MFr Bld: 6.3 % — ABNORMAL HIGH (ref 4.8–5.6)
Mean Plasma Glucose: 134.11 mg/dL

## 2019-12-26 MED ORDER — SODIUM CHLORIDE 0.9 % IV SOLN
500.0000 mg | INTRAVENOUS | Status: DC
  Administered 2019-12-26: 500 mg via INTRAVENOUS
  Filled 2019-12-26 (×2): qty 500

## 2019-12-26 MED ORDER — ACETAMINOPHEN 325 MG PO TABS
650.0000 mg | ORAL_TABLET | Freq: Four times a day (QID) | ORAL | Status: DC | PRN
  Administered 2019-12-26 (×2): 650 mg via ORAL
  Filled 2019-12-26 (×3): qty 2

## 2019-12-26 MED ORDER — INSULIN ASPART 100 UNIT/ML ~~LOC~~ SOLN
0.0000 [IU] | Freq: Three times a day (TID) | SUBCUTANEOUS | Status: DC
  Administered 2019-12-26 (×2): 2 [IU] via SUBCUTANEOUS
  Administered 2019-12-26 – 2019-12-27 (×2): 1 [IU] via SUBCUTANEOUS

## 2019-12-26 MED ORDER — PANTOPRAZOLE SODIUM 40 MG PO TBEC
40.0000 mg | DELAYED_RELEASE_TABLET | Freq: Every day | ORAL | Status: DC
  Administered 2019-12-26 – 2019-12-27 (×2): 40 mg via ORAL
  Filled 2019-12-26 (×2): qty 1

## 2019-12-26 MED ORDER — ALUM & MAG HYDROXIDE-SIMETH 200-200-20 MG/5ML PO SUSP
15.0000 mL | ORAL | Status: DC | PRN
  Administered 2019-12-26 – 2019-12-27 (×2): 15 mL via ORAL
  Filled 2019-12-26 (×2): qty 30

## 2019-12-26 MED ORDER — ONDANSETRON HCL 4 MG/2ML IJ SOLN: 4.0000 mg | Freq: Four times a day (QID) | INTRAMUSCULAR | Status: DC | PRN

## 2019-12-26 MED ORDER — ONDANSETRON HCL 4 MG PO TABS: 4.0000 mg | ORAL_TABLET | Freq: Four times a day (QID) | ORAL | Status: DC | PRN

## 2019-12-26 MED ORDER — SODIUM CHLORIDE 0.9 % IV SOLN
2.0000 g | INTRAVENOUS | Status: DC
  Administered 2019-12-26: 2 g via INTRAVENOUS
  Filled 2019-12-26: qty 20

## 2019-12-26 NOTE — Plan of Care (Signed)
Called by patient for clarification of NOAC dosing.  Dosing is principally for atrial flutter.  Patient has missed 12/25/19 PM dose; ok to restart 12/26/19 AM dose:  Ultimately will defer to primary through work up of chief complaint (fever).  Patient understands that some of his medications may change as a function of his work up and has no further questions.  Rudean Haskell MD

## 2019-12-26 NOTE — H&P (Signed)
History and Physical    Robert Summers HCW:[id number] DOB: Jul 01, 1940 DOA: 12/25/2019  PCP: Manfred Shirts, PA  Patient coming from: Home.  Chief Complaint: Fever.  HPI: Robert Summers is a 79 y.o. male with history of paroxysmal atrial flutter status post successful cardioversion, chronic and disease stage III, prediabetes [facility] with complaints of having fever.  As per the patient's wife who provided the history to the ER physician patient started having fever last 24 hours with temperature 101.8 degrees was feeling weak and fatigued.  Patient states he has chronic cough.  Denies any chest pain nausea vomiting or diarrhea.  Patient had tele visit with his primary care and had Covid test done which was negative had chest x-ray done which showed left lower lobe infiltrates.  ED Course: In the ER patient was getting hypoxic on ambulation had a fever and lab work showed leukocytosis of 12.3 sodium 131 blood sugar 242 creatinine 1.4 Covid test was negative.  Initial lactate was 3.2 which improved with fluids.  Since patient was getting easily hypoxia on ambulation patient admitted for IV antibiotics and treatment for pneumonia.  Review of Systems: As per HPI, rest all negative.   Past Medical History:  Diagnosis Date  . AKI (acute kidney injury) ([place]) 08/10/2018  . Arthritis    KNEES  . Atrial flutter ([place]) 08/10/2018  . Atrial flutter with rapid ventricular response ([place]) 08/09/2018  . Chest pain 08/09/2018  . GERD (gastroesophageal reflux disease)    OCCASIONAL ONLY  . H/O hiatal hernia   . History of gout   . History of nonmelanoma skin cancer   . Malignant neoplasm of prostate ([place]) 07/05/2013  . Prediabetes   . Prostate cancer [facility])     Past Surgical History:  Procedure Laterality Date  . CARDIOVERSION N/A 08/11/2018   Procedure: CARDIOVERSION;  Surgeon: Buford Dresser, MD;  Location: [facility] ENDOSCOPY;  Service: Cardiovascular;   Laterality: N/A;  . CIRCUMCISION    . ROBOT ASSISTED LAPAROSCOPIC RADICAL PROSTATECTOMY N/A 07/05/2013   Procedure: ROBOTIC ASSISTED LAPAROSCOPIC NERVE SPARING RADICAL PROSTATECTOMY;  Surgeon: Irine Seal, MD;  Location: [facility] ORS;  Service: Urology;  Laterality: N/A;  . SPERMATACELE     REPAIRED  . WRIST SURGERY     RT     reports that he has quit smoking. He quit smokeless tobacco use about 57 years ago. He reports current alcohol use. He reports that he does not use drugs.  Allergies  Allergen Reactions  . Lisinopril Other (See Comments)    Increased Serum Creatinine  . Nsaids Other (See Comments)    CONTRAINDICATION: Renal Insufficiency  . Latex Rash    Family History  Problem Relation Age of Onset  . CAD Father        Died of MI at age 49  . Arthritis Father   . CAD Brother        died of presumed Mi at age 41  . Diabetes Brother   . Diabetes Mother   . Stroke Mother   . AAA (abdominal aortic aneurysm) Sister   . Hypertension Sister   . Leukemia Brother   . Diabetes Brother   . Hypertension Brother   . AAA (abdominal aortic aneurysm) Brother     Prior to Admission medications   Medication Sig Start Date End Date Taking? Authorizing Provider  allopurinol (ZYLOPRIM) 100 MG tablet Take 100 mg by mouth 2 (two) times daily. 11/11/18  [provider]  carvedilol (COREG) 6.25 MG tablet Take 1 tablet (6.25 mg total) by mouth 2 (two) times daily. 11/11/18   Kroeger, Lorelee Cover., PA-C  cholecalciferol (VITAMIN D) 25 MCG (1000 UT) tablet Take 1,000 Units by mouth daily.    [provider]  ELIQUIS 5 MG TABS tablet TAKE 1 TABLET BY MOUTH TWICE A DAY 05/16/19   Martinique, Peter M, MD  MAGNESIUM CARBONATE PO Take 1 capsule by mouth daily.    [provider]  metFORMIN (GLUCOPHAGE-XR) 500 MG 24 hr tablet Take 500 mg by mouth every morning. 11/05/19   [provider]  Omega-3 Fatty Acids (FISH OIL) 1000 MG CAPS Take 1 capsule by mouth daily.    [provider]    Physical Exam: Constitutional: Moderately built and nourished. Vitals:   12/25/19 2209 12/25/19 2230 12/25/19 2330 12/26/19 0051  BP: (!) 128/92 (!) 136/104 (!) 146/85 (!) 146/70  Pulse: 65 64 69 79  Resp: 19 (!) 27 22 19   Temp:   98.9 F (37.2 C) 99.6 F (37.6 C)  TempSrc:   Oral Oral  SpO2: 100% 95% 94% 98%  Weight:      Height:       Eyes: Anicteric no pallor. ENMT: No discharge from the ears eyes nose or mouth. Neck: No mass felt.  No neck rigidity. Respiratory: No rhonchi or crepitations. Cardiovascular: S1-S2 heard. Abdomen: Soft nontender bowel sounds present. Musculoskeletal: No edema. Skin: No rash. Neurologic: Alert awake oriented to time place and person.  Moves all extremities. Psychiatric: Appears normal per normal affect.   Labs on Admission: I have personally reviewed following labs and imaging studies  CBC: Recent Labs  Lab 12/25/19 1538  WBC 12.3*  NEUTROABS 8.9*  HGB 15.2  HCT 45.0  MCV 90.7  PLT 026   Basic Metabolic Panel: Recent Labs  Lab 12/25/19 1538  NA 131*  K 4.3  CL 97*  CO2 23  GLUCOSE 242*  BUN 27*  CREATININE 1.43*  CALCIUM 8.9   GFR: Estimated Creatinine Clearance: 49.7 mL/min (A) (by C-G formula based on SCr of 1.43 mg/dL (H)). Liver Function Tests: Recent Labs  Lab 12/25/19 1538  AST 20  ALT 17  ALKPHOS 51  BILITOT 1.2  PROT 7.2  ALBUMIN 3.5   No results for input(s): LIPASE, AMYLASE in the last 168 hours. No results for input(s): AMMONIA in the last 168 hours. Coagulation Profile: No results for input(s): INR, PROTIME in the last 168 hours. Cardiac Enzymes: No results for input(s): CKTOTAL, CKMB, CKMBINDEX, TROPONINI in the last 168 hours. BNP (last 3 results) No results for input(s): PROBNP in the last 8760 hours. HbA1C: No results for input(s): HGBA1C in the last 72 hours. CBG: No results for input(s): GLUCAP in the last 168 hours. Lipid Profile: No results for input(s): CHOL, HDL,  LDLCALC, TRIG, CHOLHDL, LDLDIRECT in the last 72 hours. Thyroid Function Tests: No results for input(s): TSH, T4TOTAL, FREET4, T3FREE, THYROIDAB in the last 72 hours. Anemia Panel: No results for input(s): VITAMINB12, FOLATE, FERRITIN, TIBC, IRON, RETICCTPCT in the last 72 hours. Urine analysis:    Component Value Date/Time   COLORURINE AMBER (A) 12/25/2019 1545   APPEARANCEUR CLEAR 12/25/2019 1545   LABSPEC >1.030 (H) 12/25/2019 1545   PHURINE 5.5 12/25/2019 1545   GLUCOSEU 250 (A) 12/25/2019 1545   HGBUR MODERATE (A) 12/25/2019 1545   BILIRUBINUR SMALL (A) 12/25/2019 1545   KETONESUR NEGATIVE 12/25/2019 1545   PROTEINUR >300 (A) 12/25/2019 1545  NITRITE NEGATIVE 12/25/2019 1545   LEUKOCYTESUR NEGATIVE 12/25/2019 1545   Sepsis Labs: @LABRCNTIP (procalcitonin:4,lacticidven:4) ) Recent Results (from the past 240 hour(s))  SARS Coronavirus 2 by RT PCR (hospital order, performed in [facility] hospital lab) Nasopharyngeal Nasopharyngeal Swab     Status: None   Collection Time: 12/25/19  6:56 PM   Specimen: Nasopharyngeal Swab  Result Value Ref Range Status   SARS Coronavirus 2 NEGATIVE NEGATIVE Final    Comment: (NOTE) SARS-CoV-2 target nucleic acids are NOT DETECTED.  The SARS-CoV-2 RNA is generally detectable in upper and lower respiratory specimens during the acute phase of infection. The lowest concentration of SARS-CoV-2 viral copies this assay can detect is 250 copies / mL. A negative result does not preclude SARS-CoV-2 infection and should not be used as the sole basis for treatment or other patient management decisions.  A negative result may occur with improper specimen collection / handling, submission of specimen other than nasopharyngeal swab, presence of viral mutation(s) within the areas targeted by this assay, and inadequate number of viral copies (<250 copies / mL). A negative result must be combined with clinical observations, patient history, and  epidemiological information.  Fact Sheet for Patients:   StrictlyIdeas.no  Fact Sheet for Healthcare Providers: BankingDealers.co.za  This test is not yet approved or  cleared by the [place] FDA and has been authorized for detection and/or diagnosis of SARS-CoV-2 by FDA under an Emergency Use Authorization (EUA).  This EUA will remain in effect (meaning this test can be used) for the duration of the COVID-19 declaration under Section 564(b)(1) of the Act, 21 U.S.C. section 360bbb-3(b)(1), unless the authorization is terminated or revoked sooner.  Performed at [facility], [address] [place], [place] [address]      Radiological Exams on Admission: No results found.    Assessment/Plan Principal Problem:   Pneumonia, community acquired Active Problems:   Atrial flutter (HCC)   CKD (chronic kidney disease) stage 3, GFR 30-59 ml/min   CAD (coronary artery disease)   Type 2 diabetes mellitus with vascular disease ([place])   CAP (community acquired pneumonia)    1. Community-acquired pneumonia with the patient is getting tachypneic and hypoxic on ambulation will admit for IV antibiotics and closely monitor.  Check blood cultures urine for Legionella and strep antigen.  Covid test is negative. 2. Atrial flutter status post previous successful cardioversion on apixaban and carvedilol. 3. Prediabetes presently on sliding scale coverage.  Suspect patient may have attended diabetes given the blood sugar was 242 on admission.  Check hemoglobin A1c. 4. Chronic kidney disease stage III creatinine appears to be at baseline.  Since patient has tachypnea and hypoxia on ambulation with pneumonia and multiple comorbidities will need close monitoring for any deterioration inpatient status.   DVT prophylaxis: Apixaban. Code Status: Full code. Family Communication: Discussed with patient. Disposition Plan: Home when  stable. Consults called: None. Admission status: Inpatient.   Rise Patience MD [facility] Pager [phone].  If 7PM-7AM, please contact night-coverage [email or web address] Password [facility]  12/26/2019, 3:18 AM

## 2019-12-26 NOTE — Progress Notes (Signed)
Pt admitted w/ fever, dyspnea, weakness. pMN admission. See H&P for full details. Continue rocephin/zithro for CAP. Still with fevers, but resp status is improving. Glucose is better this morning. A1c is 6.3. If he continues to improve, look for d/c in AM. Otherwise, continue as per H&P.   General: 79 y.o. male resting in chair in NAD Cardiovascular: RRR, +S1, S2, no m/g/r, equal pulses throughout Respiratory: decreased at bases w/ some rhonchi at left base, no wheeze GI: BS+, NDNT, no masses noted, no organomegaly noted MSK: No e/c/c Neuro: Alert to name, follows commands, hard of hearing Psyc: Appropriate interaction and affect, calm/cooperative

## 2019-12-26 NOTE — Progress Notes (Signed)
Pt with fever of 101.4, no PRN's. Dr. Marylyn Ishihara paged, he placed orders for PRN tylenol. Will give and continue to monitor. Marijean Heath, Therapist, sports, BSN

## 2019-12-26 NOTE — Plan of Care (Signed)
  Problem: Education: Goal: Knowledge of General Education information will improve Description: Including pain rating scale, medication(s)/side effects and non-pharmacologic comfort measures Outcome: Progressing   Problem: Clinical Measurements: Goal: Respiratory complications will improve Outcome: Progressing Note: On room air   Problem: Activity: Goal: Risk for activity intolerance will decrease Outcome: Progressing Note: Up in room with 1 assist, tolerating well   Problem: Nutrition: Goal: Adequate nutrition will be maintained Outcome: Progressing   Problem: Coping: Goal: Level of anxiety will decrease Outcome: Progressing   Problem: Elimination: Goal: Will not experience complications related to urinary retention Outcome: Progressing   Problem: Pain Managment: Goal: General experience of comfort will improve Outcome: Progressing   Problem: Safety: Goal: Ability to remain free from injury will improve Outcome: Progressing

## 2019-12-27 ENCOUNTER — Inpatient Hospital Stay (HOSPITAL_COMMUNITY): Payer: Medicare Other

## 2019-12-27 DIAGNOSIS — J189 Pneumonia, unspecified organism: Secondary | ICD-10-CM | POA: Diagnosis not present

## 2019-12-27 LAB — CBC WITH DIFFERENTIAL/PLATELET
Abs Immature Granulocytes: 0.03 10*3/uL (ref 0.00–0.07)
Basophils Absolute: 0 10*3/uL (ref 0.0–0.1)
Basophils Relative: 0 %
Eosinophils Absolute: 0 10*3/uL (ref 0.0–0.5)
Eosinophils Relative: 0 %
HCT: 39.1 % (ref 39.0–52.0)
Hemoglobin: 13.2 g/dL (ref 13.0–17.0)
Immature Granulocytes: 0 %
Lymphocytes Relative: 21 %
Lymphs Abs: 1.5 10*3/uL (ref 0.7–4.0)
MCH: 30.1 pg (ref 26.0–34.0)
MCHC: 33.8 g/dL (ref 30.0–36.0)
MCV: 89.1 fL (ref 80.0–100.0)
Monocytes Absolute: 1 10*3/uL (ref 0.1–1.0)
Monocytes Relative: 14 %
Neutro Abs: 4.6 10*3/uL (ref 1.7–7.7)
Neutrophils Relative %: 65 %
Platelets: 152 10*3/uL (ref 150–400)
RBC: 4.39 MIL/uL (ref 4.22–5.81)
RDW: 13.2 % (ref 11.5–15.5)
WBC: 7.1 10*3/uL (ref 4.0–10.5)
nRBC: 0 % (ref 0.0–0.2)

## 2019-12-27 LAB — RENAL FUNCTION PANEL
Albumin: 2.3 g/dL — ABNORMAL LOW (ref 3.5–5.0)
Anion gap: 9 (ref 5–15)
BUN: 32 mg/dL — ABNORMAL HIGH (ref 8–23)
CO2: 22 mmol/L (ref 22–32)
Calcium: 8.4 mg/dL — ABNORMAL LOW (ref 8.9–10.3)
Chloride: 102 mmol/L (ref 98–111)
Creatinine, Ser: 1.64 mg/dL — ABNORMAL HIGH (ref 0.61–1.24)
GFR calc Af Amer: 45 mL/min — ABNORMAL LOW (ref >60)
GFR calc non Af Amer: 39 mL/min — ABNORMAL LOW (ref >60)
Glucose, Bld: 113 mg/dL — ABNORMAL HIGH (ref 70–99)
Phosphorus: 3.4 mg/dL (ref 2.5–4.6)
Potassium: 3.7 mmol/L (ref 3.5–5.1)
Sodium: 133 mmol/L — ABNORMAL LOW (ref 135–145)

## 2019-12-27 LAB — GLUCOSE, CAPILLARY
Glucose-Capillary: 96 mg/dL (ref 70–99)
Glucose-Capillary: 143 mg/dL — ABNORMAL HIGH (ref 70–99)

## 2019-12-27 LAB — LEGIONELLA PNEUMOPHILA SEROGP 1 UR AG: L. pneumophila Serogp 1 Ur Ag: NEGATIVE

## 2019-12-27 LAB — MAGNESIUM: Magnesium: 2 mg/dL (ref 1.7–2.4)

## 2019-12-27 MED ORDER — LEVOFLOXACIN 500 MG PO TABS
500.0000 mg | ORAL_TABLET | Freq: Once | ORAL | Status: AC
  Administered 2019-12-27: 500 mg via ORAL
  Filled 2019-12-27: qty 1

## 2019-12-27 MED ORDER — AZITHROMYCIN 500 MG PO TABS: 500.0000 mg | ORAL_TABLET | Freq: Every day | ORAL | Status: DC

## 2019-12-27 MED ORDER — CEFDINIR 300 MG PO CAPS
300.0000 mg | ORAL_CAPSULE | Freq: Two times a day (BID) | ORAL | 0 refills | Status: AC
Start: 2019-12-27 — End: 2019-12-29

## 2019-12-27 NOTE — Progress Notes (Signed)
D/C inst given, verbalized understanding. Amubulated in the hall without SOB. SL removed. D/C'd home

## 2019-12-27 NOTE — TOC Initial Note (Signed)
Transition of Care [facility]) - Initial/Assessment Note    Patient Details  Name: Robert Summers MRN: [id number] Date of Birth: 29-Sep-1940  [facility] CM/SW Contact:    Curlene Labrum, RN Phone Number: 12/27/2019, 9:28 AM  Clinical Narrative:                 Case management met with patient who is independent and lives with wife - Admitted for workup for pneumonia.  Patient ambulates without assistance and will be returning home with his wife with no needs as identified with assessment..   Expected Discharge Plan: Home/Self Care Barriers to Discharge: Continued Medical Work up   Patient Goals and CMS Choice Patient states their goals for this hospitalization and ongoing recovery are:: Plans to return home. CMS Medicare.gov Compare Post Acute Care list provided to:: Patient Choice offered to / list presented to : Patient  Expected Discharge Plan and Services Expected Discharge Plan: Home/Self Care   Discharge Planning Services: CM Consult   Living arrangements for the past 2 months: Single Family Home                                      Prior Living Arrangements/Services Living arrangements for the past 2 months: Single Family Home Lives with:: Spouse Patient language and need for interpreter reviewed:: Yes Do you feel safe going back to the place where you live?: Yes      Need for Family Participation in Patient Care: Yes (Comment) Care giver support system in place?: Yes (comment)   Criminal Activity/Legal Involvement Pertinent to Current Situation/Hospitalization: No - Comment as needed  Activities of Daily Living Home Assistive Devices/Equipment: None ADL Screening (condition at time of admission) Patient's cognitive ability adequate to safely complete daily activities?: Yes Is the patient deaf or have difficulty hearing?: Yes Does the patient have difficulty seeing, even when wearing glasses/contacts?: No Does the patient have  difficulty concentrating, remembering, or making decisions?: No Patient able to express need for assistance with ADLs?: Yes Does the patient have difficulty dressing or bathing?: No Independently performs ADLs?: Yes (appropriate for developmental age) Does the patient have difficulty walking or climbing stairs?: No Weakness of Legs: None Weakness of Arms/Hands: None  Permission Sought/Granted Permission sought to share information with : Case Manager Permission granted to share information with : Yes, Verbal Permission Granted        Permission granted to share info w Relationship: spouse     Emotional Assessment Appearance:: Appears stated age Attitude/Demeanor/Rapport: Gracious Affect (typically observed): Accepting Orientation: : Oriented to Self, Oriented to Place, Oriented to  Time, Oriented to Situation Alcohol / Substance Use: Not Applicable Psych Involvement: No (comment)  Admission diagnosis:  Pneumonia, community acquired [J18.9] Community acquired pneumonia of left lung, unspecified part of lung [J18.9] CAP (community acquired pneumonia) [J18.9] Patient Active Problem List   Diagnosis Date Noted  . CKD (chronic kidney disease) stage 3, GFR 30-59 ml/min 12/26/2019  . CAD (coronary artery disease) 12/26/2019  . CAP (community acquired pneumonia) 12/26/2019  . Pneumonia, community acquired 12/25/2019  . AKI (acute kidney injury) ([place]) 08/10/2018  . Atrial flutter ([place]) 08/10/2018  . Atrial flutter with rapid ventricular response ([place]) 08/09/2018  . Chest pain 08/09/2018  . Malignant neoplasm of prostate ([place]) 07/05/2013   PCP:  Manfred Shirts, PA Pharmacy:   [id number] [place], [address]  [place] [address] Phone: [phone] Fax: [place], [place] - [address] [place] [address] Phone: [phone] Fax:  [phone]     Social Determinants of Health (SDOH) Interventions    Readmission Risk Interventions Readmission Risk Prevention Plan 12/27/2019  Transportation Screening Complete  PCP or Specialist Appt within 5-7 Days Complete  Home Care Screening Complete  Medication Review (RN CM) Complete  Some recent data might be hidden

## 2019-12-27 NOTE — Plan of Care (Signed)
  Problem: Education: Goal: Knowledge of General Education information will improve Description: Including pain rating scale, medication(s)/side effects and non-pharmacologic comfort measures 12/27/2019 1236 by Melina Schools, RN Outcome: Adequate for Discharge 12/27/2019 1131 by Melina Schools, RN Outcome: Progressing   Problem: Health Behavior/Discharge Planning: Goal: Ability to manage health-related needs will improve Outcome: Adequate for Discharge   Problem: Clinical Measurements: Goal: Ability to maintain clinical measurements within normal limits will improve Outcome: Adequate for Discharge Goal: Will remain free from infection Outcome: Adequate for Discharge Goal: Diagnostic test results will improve Outcome: Adequate for Discharge Goal: Respiratory complications will improve Outcome: Adequate for Discharge Goal: Cardiovascular complication will be avoided Outcome: Adequate for Discharge   Problem: Activity: Goal: Risk for activity intolerance will decrease Outcome: Adequate for Discharge   Problem: Nutrition: Goal: Adequate nutrition will be maintained Outcome: Adequate for Discharge   Problem: Coping: Goal: Level of anxiety will decrease Outcome: Adequate for Discharge   Problem: Elimination: Goal: Will not experience complications related to bowel motility Outcome: Adequate for Discharge Goal: Will not experience complications related to urinary retention Outcome: Adequate for Discharge   Problem: Pain Managment: Goal: General experience of comfort will improve 12/27/2019 1236 by Melina Schools, RN Outcome: Adequate for Discharge 12/27/2019 1131 by Melina Schools, RN Outcome: Progressing   Problem: Safety: Goal: Ability to remain free from injury will improve Outcome: Adequate for Discharge   Problem: Skin Integrity: Goal: Risk for impaired skin integrity will decrease Outcome: Adequate for Discharge

## 2019-12-27 NOTE — Discharge Summary (Signed)
Physician Discharge Summary  Robert Summers QPR:[id number] DOB: 1940/07/02 DOA: 12/25/2019  PCP: Manfred Shirts, PA  Admit date: 12/25/2019 Discharge date: 12/27/2019  Time spent: 37 minutes  Recommendations for Outpatient Follow-up:  1. Recommend CBC Chem-12 as outpatient 1 week to 10 days 2. Recommend CXR 1 month as outpatient 3. Resume blood thinners in the outpatient setting as well as other meds  Discharge Diagnoses:  Principal Problem:   Pneumonia, community acquired Active Problems:   Atrial flutter ([place])   CKD (chronic kidney disease) stage 3, GFR 30-59 ml/min   CAD (coronary artery disease)   CAP (community acquired pneumonia)   Discharge Condition: Improved  Diet recommendation: Heart healthy  Filed Weights   12/25/19 1522  Weight: (!) 103.4 kg    History of present illness:  71 white male P flutter DCCV CKD 2-3 DM TY 2 on Metformin Admitted from [facility] 7/27 T-max 101-Covid was negative PCP told him to come to ER CXR showed left lower lobe infiltrates--WBC was 12.3 lactate 3.2 after IV fluid  Hospital Course:  Community-acquired pneumonia Placed on initial IV azithromycin and ceftriaxone transitioned rapidly 7/28 to cefdinir twice daily to complete 2 more days He will have ambulatory saturations done-he has h/o recent inactivity and deconditioning 2/2 not going to gym/coronavirus 19 which probably plays a role He is coherent and has had no fever over 24 hours He should have an x-ray in the outpatient setting  Atrial flutter status post DCCV 08/11/2018 Continue Coreg and Eliquis  Prostate cancer status post radical prostatectomy 07/05/2013 Outpatient follow-up Dr. Jeffie Pollock  DM TY 2 continue Metformin    Discharge Exam: Vitals:   12/27/19 0649 12/27/19 0740  BP: (!) 110/57 (!) 114/49  Pulse: 61 61  Resp: 18 17  Temp:  98.9 F (37.2 C)  SpO2: 95% 97%    General: Awake coherent very hard of hearing pleasant sits up in bed without  difficulty has been walking around the room good dentition Cardiovascular: S1-S2 no murmur sinus rhythm on monitor Respiratory: Clear no added sound mild crackle right lower lung field no rales no rhonchi no wheezes No lower extremity rash however grade 1 pitting edema Neurologically intact moving all 4 limbs equally Abdomen soft nontender no rebound  Discharge Instructions   Discharge Instructions    Diet - low sodium heart healthy   Complete by: As directed    Discharge instructions   Complete by: As directed    Please complete the antibiotics as per instructions and you can take a probiotic with yogurt once or twice a day to prevent diarrhea or other illnesses Continue your Metformin as from prior to admission in addition to all your other heart medications including your blood thinner You should get an x-ray in about 1 month after discharge to ensure that the pneumonia is healed up My suggestion also is to get lab work in about 1 week to 10 days at your primary care physician's office Good luck have a nice summer go slowly with activity   Increase activity slowly   Complete by: As directed      Allergies as of 12/27/2019      Reactions   Lisinopril Other (See Comments)   Increased Serum Creatinine   Nsaids Other (See Comments)   CONTRAINDICATION: Renal Insufficiency   Latex Rash      Medication List    TAKE these medications   acetaminophen 500 MG tablet Commonly known as: TYLENOL Take 500 mg by mouth  every 6 (six) hours as needed for mild pain or moderate pain.   allopurinol 100 MG tablet Commonly known as: ZYLOPRIM Take 100 mg by mouth 2 (two) times daily.   carvedilol 6.25 MG tablet Commonly known as: COREG Take 1 tablet (6.25 mg total) by mouth 2 (two) times daily. What changed: how much to take   cefdinir 300 MG capsule Commonly known as: OMNICEF Take 1 capsule (300 mg total) by mouth 2 (two) times daily for 2 days.   Eliquis 5 MG Tabs tablet Generic  drug: apixaban TAKE 1 TABLET BY MOUTH TWICE A DAY What changed: how much to take   Fish Oil 1000 MG Caps Take 1 capsule by mouth daily.   metFORMIN 500 MG 24 hr tablet Commonly known as: GLUCOPHAGE-XR Take 500 mg by mouth every morning.   vitamin B-12 1000 MCG tablet Commonly known as: CYANOCOBALAMIN Take 1,000 mcg by mouth daily.   Vitamin D3 50 MCG (2000 UT) capsule Take 2,000 Units by mouth daily.      Allergies  Allergen Reactions  . Lisinopril Other (See Comments)    Increased Serum Creatinine  . Nsaids Other (See Comments)    CONTRAINDICATION: Renal Insufficiency  . Latex Rash      The results of significant diagnostics from this hospitalization (including imaging, microbiology, ancillary and laboratory) are listed below for reference.    Significant Diagnostic Studies: No results found.  Microbiology: Recent Results (from the past 240 hour(s))  Blood culture (routine x 2)     Status: None (Preliminary result)   Collection Time: 12/25/19  4:34 PM   Specimen: BLOOD  Result Value Ref Range Status   Specimen Description   Final    BLOOD LEFT ANTECUBITAL Performed at [facility], [address] [place], [place] [address]    Special Requests   Final    BOTTLES DRAWN AEROBIC AND ANAEROBIC Blood Culture adequate volume Performed at [facility], [address] [place], [place] [address]    Culture   Final    NO GROWTH < 24 HOURS Performed at [facility], [place] [address] [place], [place] [address]    Report Status PENDING  Incomplete  Blood culture (routine x 2)     Status: None (Preliminary result)   Collection Time: 12/25/19  4:34 PM   Specimen: BLOOD  Result Value Ref Range Status   Specimen Description   Final    BLOOD RIGHT ANTECUBITAL Performed at [facility], [address] [place], [place] [address]    Special Requests   Final    BOTTLES DRAWN AEROBIC AND ANAEROBIC Blood Culture results may not be  optimal due to an inadequate volume of blood received in culture bottles Performed at [facility], [address] [place], [place] [address]    Culture   Final    NO GROWTH < 24 HOURS Performed at [facility], [place] [address], [place],  [address]    Report Status PENDING  Incomplete  SARS Coronavirus 2 by RT PCR (hospital order, performed in [facility] hospital lab) Nasopharyngeal Nasopharyngeal Swab     Status: None   Collection Time: 12/25/19  6:56 PM   Specimen: Nasopharyngeal Swab  Result Value Ref Range Status   SARS Coronavirus 2 NEGATIVE NEGATIVE Final    Comment: (NOTE) SARS-CoV-2 target nucleic acids are NOT DETECTED.  The SARS-CoV-2 RNA is generally detectable in upper and lower respiratory  specimens during the acute phase of infection. The lowest concentration of SARS-CoV-2 viral copies this assay can detect is 250 copies / mL. A negative result does not preclude SARS-CoV-2 infection and should not be used as the sole basis for treatment or other patient management decisions.  A negative result may occur with improper specimen collection / handling, submission of specimen other than nasopharyngeal swab, presence of viral mutation(s) within the areas targeted by this assay, and inadequate number of viral copies (<250 copies / mL). A negative result must be combined with clinical observations, patient history, and epidemiological information.  Fact Sheet for Patients:   StrictlyIdeas.no  Fact Sheet for Healthcare Providers: BankingDealers.co.za  This test is not yet approved or  cleared by the [place] FDA and has been authorized for detection and/or diagnosis of SARS-CoV-2 by FDA under an Emergency Use Authorization (EUA).  This EUA will remain in effect (meaning this test can be used) for the duration of the COVID-19 declaration under Section 564(b)(1) of the Act, 21 U.S.C. section  360bbb-3(b)(1), unless the authorization is terminated or revoked sooner.  Performed at [facility], [address] [place], [place] [address]      Labs: Basic Metabolic Panel: Recent Labs  Lab 12/25/19 1538 12/26/19 0324 12/27/19 0403  NA 131* 133* 133*  K 4.3 3.9 3.7  CL 97* 102 102  CO2 23 21* 22  GLUCOSE 242* 137* 113*  BUN 27* 22 32*  CREATININE 1.43* 1.47* 1.64*  CALCIUM 8.9 8.4* 8.4*  MG  --   --  2.0  PHOS  --   --  3.4   Liver Function Tests: Recent Labs  Lab 12/25/19 1538 12/26/19 0324 12/27/19 0403  AST 20 18  --   ALT 17 17  --   ALKPHOS 51 41  --   BILITOT 1.2 0.9  --   PROT 7.2 5.8*  --   ALBUMIN 3.5 2.5* 2.3*   No results for input(s): LIPASE, AMYLASE in the last 168 hours. No results for input(s): AMMONIA in the last 168 hours. CBC: Recent Labs  Lab 12/25/19 1538 12/26/19 0324 12/27/19 0403  WBC 12.3* 9.8 7.1  NEUTROABS 8.9* 7.4 4.6  HGB 15.2 14.1 13.2  HCT 45.0 41.4 39.1  MCV 90.7 90.4 89.1  PLT 170 164 152   Cardiac Enzymes: No results for input(s): CKTOTAL, CKMB, CKMBINDEX, TROPONINI in the last 168 hours. BNP: BNP (last 3 results) No results for input(s): BNP in the last 8760 hours.  ProBNP (last 3 results) No results for input(s): PROBNP in the last 8760 hours.  CBG: Recent Labs  Lab 12/26/19 0820 12/26/19 1144 12/26/19 1623 12/26/19 2212 12/27/19 0650  GLUCAP 153* 159* 152* 137* 96       Signed:  Nita Sells MD   [facility] 12/27/2019, 11:38 AM

## 2019-12-30 LAB — CULTURE, BLOOD (ROUTINE X 2)
Culture: NO GROWTH
Culture: NO GROWTH
Special Requests: ADEQUATE

## 2020-01-24 ENCOUNTER — Other Ambulatory Visit: Payer: Self-pay

## 2020-01-24 ENCOUNTER — Encounter (INDEPENDENT_AMBULATORY_CARE_PROVIDER_SITE_OTHER): Payer: Self-pay | Admitting: Otolaryngology

## 2020-01-24 ENCOUNTER — Ambulatory Visit (INDEPENDENT_AMBULATORY_CARE_PROVIDER_SITE_OTHER): Payer: Medicare Other | Admitting: Otolaryngology

## 2020-01-24 VITALS — Temp 96.1°F

## 2020-01-24 DIAGNOSIS — H6123 Impacted cerumen, bilateral: Secondary | ICD-10-CM

## 2020-01-24 NOTE — Progress Notes (Signed)
HPI: Robert Summers is a 79 y.o. male who presents for evaluation of wax removal for annual visits.  He wears hearing aids in both ears sometimes..  Past Medical History:  Diagnosis Date  . AKI (acute kidney injury) ([place]) 08/10/2018  . Arthritis    KNEES  . Atrial flutter ([place]) 08/10/2018  . Atrial flutter with rapid ventricular response ([place]) 08/09/2018  . Chest pain 08/09/2018  . GERD (gastroesophageal reflux disease)    OCCASIONAL ONLY  . H/O hiatal hernia   . History of gout   . History of nonmelanoma skin cancer   . Malignant neoplasm of prostate ([place]) 07/05/2013  . Prediabetes   . Prostate cancer [facility])    Past Surgical History:  Procedure Laterality Date  . CARDIOVERSION N/A 08/11/2018   Procedure: CARDIOVERSION;  Surgeon: Buford Dresser, MD;  Location: [facility] ENDOSCOPY;  Service: Cardiovascular;  Laterality: N/A;  . CIRCUMCISION    . ROBOT ASSISTED LAPAROSCOPIC RADICAL PROSTATECTOMY N/A 07/05/2013   Procedure: ROBOTIC ASSISTED LAPAROSCOPIC NERVE SPARING RADICAL PROSTATECTOMY;  Surgeon: Irine Seal, MD;  Location: [facility] ORS;  Service: Urology;  Laterality: N/A;  . SPERMATACELE     REPAIRED  . WRIST SURGERY     RT   Social History   Socioeconomic History  . Marital status: Married    Spouse name: Not on file  . Number of children: Not on file  . Years of education: Not on file  . Highest education level: Not on file  Occupational History  . Not on file  Tobacco Use  . Smoking status: Former Research scientist (life sciences)  . Smokeless tobacco: Former Systems developer    Quit date: 06/28/1962  Vaping Use  . Vaping Use: Never used  Substance and Sexual Activity  . Alcohol use: Yes    Comment: RARE  . Drug use: No  . Sexual activity: Not on file  Other Topics Concern  . Not on file  Social History Narrative  . Not on file   Social Determinants of Health   Financial Resource Strain:   . Difficulty of Paying Living Expenses: Not on file  Food Insecurity:   . Worried About Charity fundraiser in the  Last Year: Not on file  . Ran Out of Food in the Last Year: Not on file  Transportation Needs:   . Lack of Transportation (Medical): Not on file  . Lack of Transportation (Non-Medical): Not on file  Physical Activity:   . Days of Exercise per Week: Not on file  . Minutes of Exercise per Session: Not on file  Stress:   . Feeling of Stress : Not on file  Social Connections:   . Frequency of Communication with Friends and Family: Not on file  . Frequency of Social Gatherings with Friends and Family: Not on file  . Attends Religious Services: Not on file  . Active Member of Clubs or Organizations: Not on file  . Attends Archivist Meetings: Not on file  . Marital Status: Not on file   Family History  Problem Relation Age of Onset  . CAD Father        Died of MI at age 13  . Arthritis Father   . CAD Brother        died of presumed Mi at age 72  . Diabetes Brother   . Diabetes Mother   . Stroke Mother   . AAA (abdominal aortic aneurysm) Sister   . Hypertension Sister   . Leukemia Brother   .  Diabetes Brother   . Hypertension Brother   . AAA (abdominal aortic aneurysm) Brother    Allergies  Allergen Reactions  . Lisinopril Other (See Comments)    Increased Serum Creatinine  . Nsaids Other (See Comments)    CONTRAINDICATION: Renal Insufficiency  . Latex Rash   Prior to Admission medications   Medication Sig Start Date End Date Taking? Authorizing Provider  acetaminophen (TYLENOL) 500 MG tablet Take 500 mg by mouth every 6 (six) hours as needed for mild pain or moderate pain.   Yes [provider]  allopurinol (ZYLOPRIM) 100 MG tablet Take 100 mg by mouth 2 (two) times daily. 11/11/18  Yes [provider]  carvedilol (COREG) 6.25 MG tablet Take 1 tablet (6.25 mg total) by mouth 2 (two) times daily. Patient taking differently: Take 3.125 mg by mouth 2 (two) times daily.  11/11/18  Yes Kroeger, Lorelee Cover., PA-C  Cholecalciferol (VITAMIN D3) 50 MCG (2000  UT) capsule Take 2,000 Units by mouth daily.   Yes [provider]  ELIQUIS 5 MG TABS tablet TAKE 1 TABLET BY MOUTH TWICE A DAY Patient taking differently: Take 5 mg by mouth 2 (two) times daily.  05/16/19  Yes Martinique, Peter M, MD  metFORMIN (GLUCOPHAGE-XR) 500 MG 24 hr tablet Take 500 mg by mouth every morning. 11/05/19  Yes [provider]  Omega-3 Fatty Acids (FISH OIL) 1000 MG CAPS Take 1 capsule by mouth daily.   Yes [provider]  vitamin B-12 (CYANOCOBALAMIN) 1000 MCG tablet Take 1,000 mcg by mouth daily.   Yes [provider]     Positive ROS: Otherwise negative  All other systems have been reviewed and were otherwise negative with the exception of those mentioned in the HPI and as above.  Physical Exam: Constitutional: Alert, well-appearing, no acute distress Ears: External ears without lesions or tenderness. Ear canals with a large amount of wax buildup in both ear canals that was cleaned with forceps and curettes.  TMs were clear bilaterally.. Nasal: External nose without lesions. Clear nasal passages Oral: Oropharynx clear. Neck: No palpable adenopathy or masses Respiratory: Breathing comfortably  Skin: No facial/neck lesions or rash noted.  Cerumen impaction removal  Date/Time: 01/24/2020 8:41 AM Performed by: Rozetta Nunnery, MD Authorized by: Rozetta Nunnery, MD   Consent:    Consent obtained:  Verbal   Consent given by:  Patient   Risks discussed:  Pain and bleeding Procedure details:    Location:  L ear and R ear   Procedure type: curette and forceps   Post-procedure details:    Inspection:  TM intact and canal normal   Hearing quality:  Improved   Patient tolerance of procedure:  Tolerated well, no immediate complications Comments:     TMs are clear bilaterally    Assessment: Bilateral cerumen buildup  Plan: Ear canals were cleaned in the office today. He will follow up on annual basis or as  needed  Radene Journey, MD

## 2020-02-26 ENCOUNTER — Other Ambulatory Visit: Payer: Self-pay | Admitting: Cardiology

## 2020-02-26 DIAGNOSIS — I4892 Unspecified atrial flutter: Secondary | ICD-10-CM

## 2020-02-26 NOTE — Telephone Encounter (Signed)
13m 103.4kg Scr 1.64 12/27/19 Lovw/jordan 11/13/19 Pt requesting 5 mg eliquis but qualifies for 2.5mg  will route to pharmd pool

## 2020-02-27 NOTE — Telephone Encounter (Signed)
Scr > 1.5 only once while in Hospital. Baseline ~ 1.4  Please refill for 30 days and order repeat BMET to re-assess renal function

## 2020-03-01 ENCOUNTER — Telehealth: Payer: Self-pay | Admitting: Cardiology

## 2020-03-01 MED ORDER — APIXABAN 5 MG PO TABS: 5.0000 mg | ORAL_TABLET | Freq: Two times a day (BID) | ORAL | 2 refills | Status: DC

## 2020-03-01 NOTE — Telephone Encounter (Signed)
Returned call to pt he took care of this at the pharmacy he just needs a refill. He will CB in Feb 2022 to schedule f/u

## 2020-03-01 NOTE — Telephone Encounter (Signed)
Pt c/o medication issue:  1. Name of Medication: apixaban (ELIQUIS) 5 MG TABS tablet  2. How are you currently taking this medication (dosage and times per day)? 5 mg 2x daily as directed  3. Are you having a reaction (difficulty breathing--STAT)? no  4. What is your medication issue? The patient said the pharmacy only gave him a 30 day supply but charged him for the full 90 day supply.  He got a refund from the pharmacy but only has about 4 days left of medication. The [id number] [place], [place]. Please address the issue ASAP

## 2020-05-02 ENCOUNTER — Telehealth: Payer: Self-pay | Admitting: Cardiology

## 2020-05-02 NOTE — Telephone Encounter (Signed)
   Pt's wife calling, she said pt was diagnosed with lymphoma, she wanted to speak with Dr. Martinique because pt was given different option for treatment. One of those is to take ibrutinib she said this can cause Aflutter, bruising and bleeding. She wanted to speak with Dr. Martinique to help her to make decision

## 2020-05-02 NOTE — Telephone Encounter (Signed)
Returned call to patient and his wife concerning recent diagnosis of lymphoma and potential treatments. There are some CV risks with treatment including HTN, edema and arrhythmias. I encouraged them to pursue treatment given tumor load and we can monitor and manage any CV issues if and when they arise.  Laloni Rowton Martin[place] MD, [facility]

## 2020-05-06 ENCOUNTER — Other Ambulatory Visit: Payer: Self-pay | Admitting: Cardiology

## 2020-05-06 MED ORDER — CARVEDILOL 6.25 MG PO TABS: 6.2500 mg | ORAL_TABLET | Freq: Two times a day (BID) | ORAL | 3 refills | Status: DC

## 2020-05-06 NOTE — Telephone Encounter (Signed)
°*  STAT* If patient is at the pharmacy, call can be transferred to refill team.   1. Which medications need to be refilled? (please list name of each medication and dose if known) carvedilol (COREG) 6.25 MG tablet  2. Which pharmacy/location (including street and city if local pharmacy) is medication to be sent to? [id number] [place], [place]  3. Do they need a 30 day or 90 day supply? [place]

## 2020-05-10 ENCOUNTER — Telehealth: Payer: Self-pay | Admitting: Cardiology

## 2020-05-10 NOTE — Telephone Encounter (Signed)
Returned call to patient's wife no answer.LMTC. 

## 2020-05-10 NOTE — Telephone Encounter (Signed)
Spoke to patient's wife.She stated husband has cancer and is presently in a clinical trial in which the medication could cause afib.Stated she is concerned his pulse has been low.Today pulse 48 B/P 159/72.Pulse yesterday 42.He is taking Coreg 6.25 mg 1/2 tablet twice a day.Advised Dr.Jordan is out of office this afternoon.I will send message to him for advice.

## 2020-05-10 NOTE — Telephone Encounter (Signed)
    Pt's wife said one of pt's scan show pt have low HR. She wanted ask Dr. Martinique if pt's medications needs changes

## 2020-05-10 NOTE — Telephone Encounter (Signed)
Robert Summers is returning Cheryl's call. Please advise.

## 2020-05-12 NOTE — Telephone Encounter (Signed)
Reduce Coreg to 3.125 mg bid. Let us know if HR staying less than 50. I already encouraged him to proceed with treatment for his Cancer.  Xoie Kreuser [place] MD, [facility]

## 2020-05-13 MED ORDER — HYDRALAZINE HCL 25 MG PO TABS
25.0000 mg | ORAL_TABLET | Freq: Three times a day (TID) | ORAL | 3 refills | Status: DC
Start: 2020-05-13 — End: 2020-05-27

## 2020-05-13 NOTE — Telephone Encounter (Signed)
Spoke to patient's wife Dr.Jordan's advice given.Advised to stop coreg and start hydralazine 25 mg three time a day.Advised to call back if needed.

## 2020-05-13 NOTE — Telephone Encounter (Signed)
I have tried repeatedly to talk with his oncologist but have not been able to make contact. From reviewing her notes I reviewed the potential medications they would use to treat his cancer. The most common side effect is edema. There may be some increase in BP or risk of arrhythmia. From what I can see I don't think the potential risk would preclude him from proceeding with treatment.   Given his low HR I would recommend stopping Coreg. I would start Hydralazine 25 mg tid for HTN.   Samanta Gal [place] MD, [facility]

## 2020-05-13 NOTE — Telephone Encounter (Signed)
Spoke to patient's wife she stated husband is already taking Coreg 3.125 mg twice a day.Stated he has been taking 3.125 mg since he started.Pulse over the weekend is averaging 48.Stated she is concerned about the 3 medications he will be taking in this trial.Stated side effects are bruising,bleeding,his platelets are 139. Stated she would like Dr.Jordan to speak to oncologist Dr.Rakhee Vaidya at # [phone].Advised I will send message to [place] for advice.

## 2020-05-27 ENCOUNTER — Telehealth: Payer: Self-pay

## 2020-05-27 NOTE — Telephone Encounter (Signed)
Spoke to patient's wife she wanted Dr.Jordan to know Dr.Vaidya advised to restart Coreg 3.125 mg twice a day and stop Hydralazine.Stated husband's heart rate is better 61 to 70.B/P 154/83.Stated Dr.Vaidya felt since he will be starting this new trial it would be better to take Coreg.Spoke to Dr.Jordan he advised ok to restart Coreg 3.125 mg twice a day and stop Hydralazine.

## 2020-06-10 ENCOUNTER — Telehealth: Payer: Self-pay | Admitting: Cardiology

## 2020-06-10 NOTE — Telephone Encounter (Signed)
I sent mychart message back to provider and nurse.  MD has already responded via mychart.

## 2020-06-10 NOTE — Telephone Encounter (Signed)
New Message:    Wife called and said she wanted Malachy Mood to look at the message she sent in My Chart and to call her back please.

## 2020-06-12 NOTE — Telephone Encounter (Signed)
He has not had recurrent Atrial flutter since March 2020. He his now > 80 yo and has CKD so I think it is reasonable to reduce Eliquis to 2.5 mg bid.  Peter Martinique MD, [facility]

## 2020-06-13 ENCOUNTER — Telehealth: Payer: Self-pay | Admitting: Cardiology

## 2020-06-13 ENCOUNTER — Other Ambulatory Visit: Payer: Self-pay

## 2020-06-13 MED ORDER — APIXABAN 2.5 MG PO TABS
2.5000 mg | ORAL_TABLET | Freq: Two times a day (BID) | ORAL | 3 refills | Status: DC
Start: 2020-06-13 — End: 2021-09-04

## 2020-06-13 NOTE — Telephone Encounter (Signed)
Returned call to patient no answer.Left message to call.

## 2020-06-13 NOTE — Telephone Encounter (Signed)
Patient states he is returning a call from [facility] yesterday.

## 2020-06-13 NOTE — Telephone Encounter (Signed)
Spoke to patient's wife.Dr.Jordan advised ok to decrease Eliquis to 2.5 mg twice a day.[facility] [address]

## 2020-08-02 IMAGING — US US RENAL
1 series · 14 of 25 positions shown · non-contrast
Comparison: None.

CLINICAL DATA: CKD stage 3

EXAM:
RENAL / URINARY TRACT ULTRASOUND COMPLETE

[Series 1: us renal · 0.25mm/px · 14 of 37 slices shown]
[im 1/37]
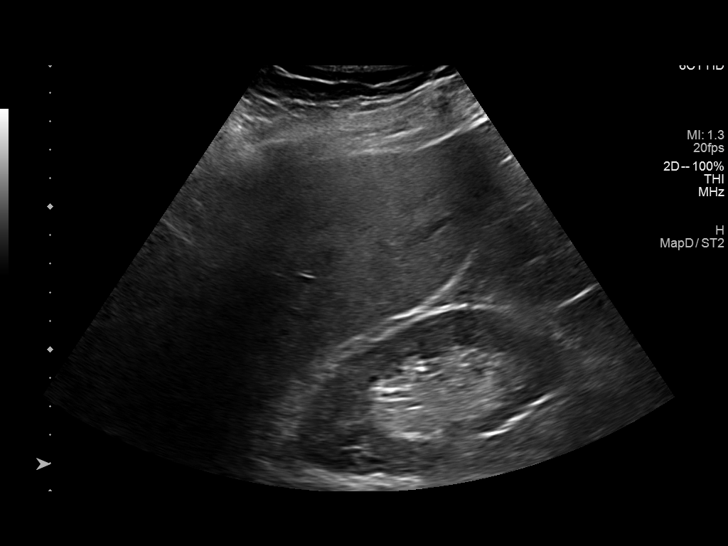
[im 4/37]
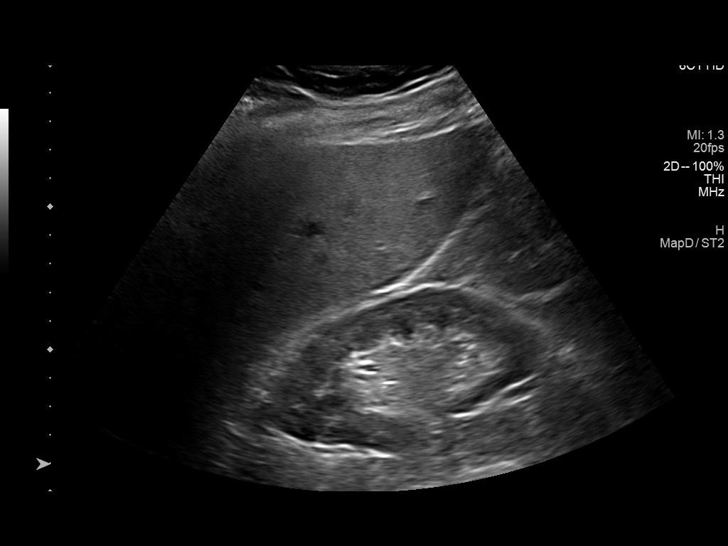
[im 7/37]
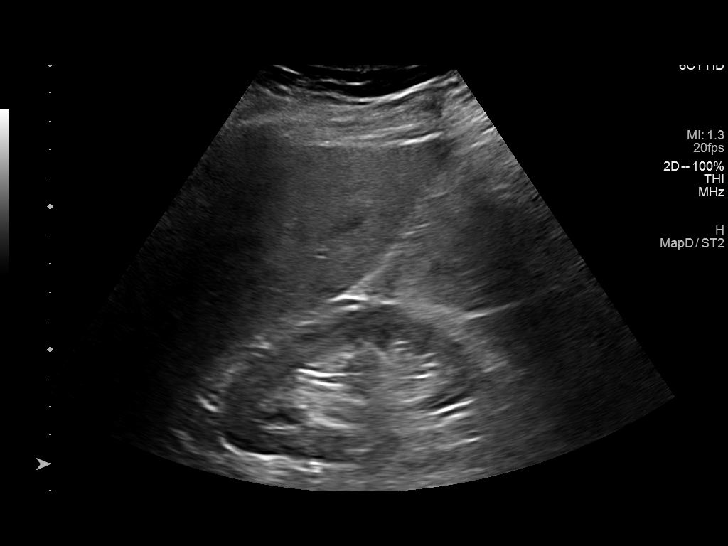
[im 10/37]
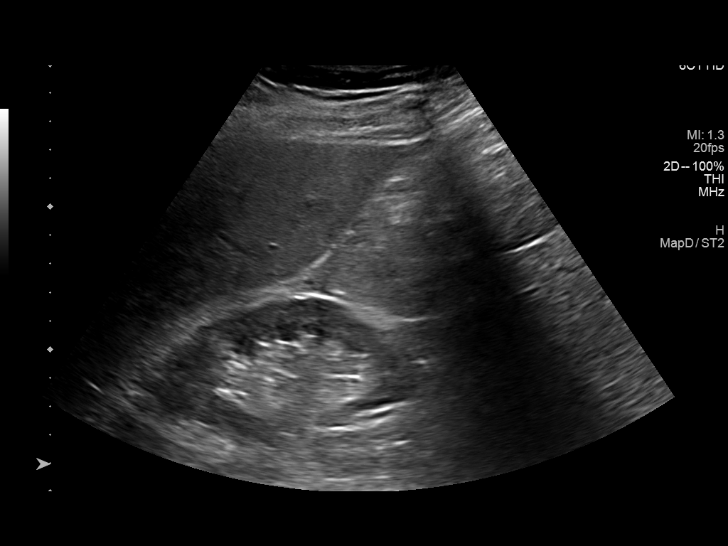
[im 13/37]
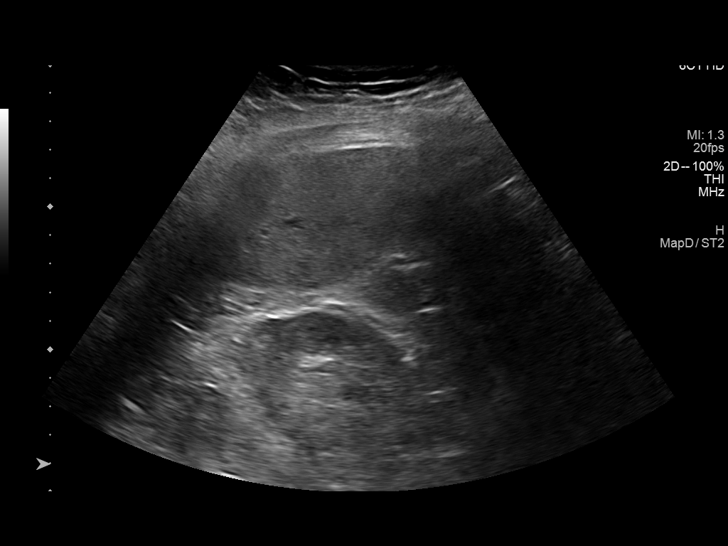
[im 14/37]
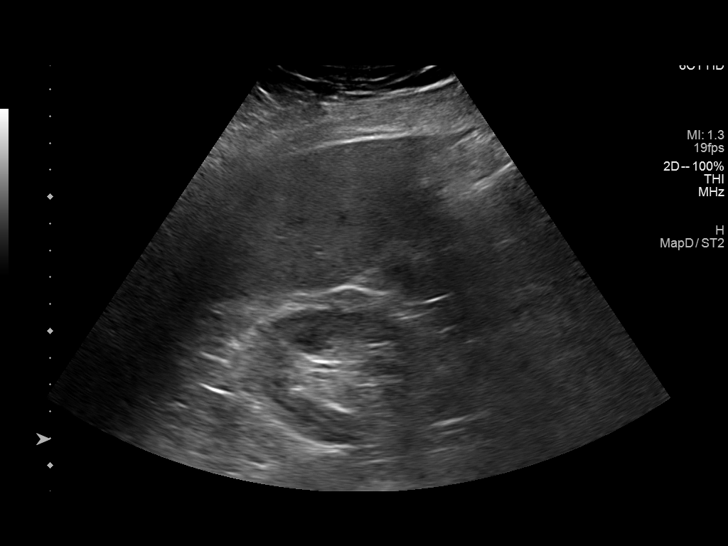
[im 17/37]
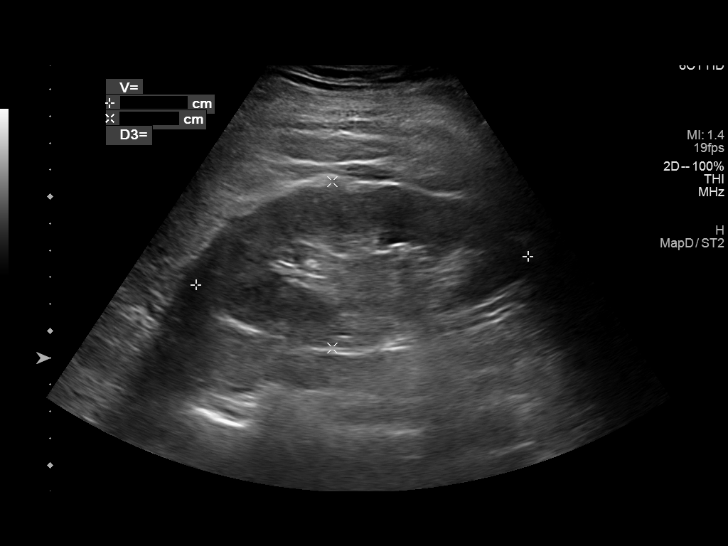
[im 20/37]
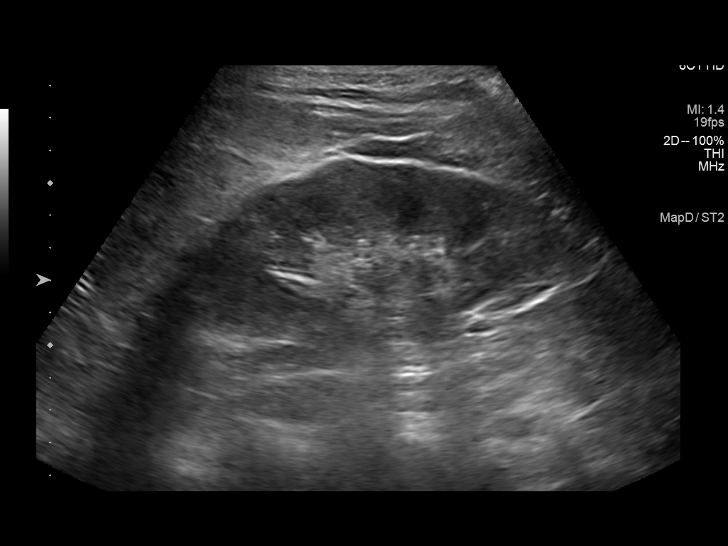
[im 23/37]
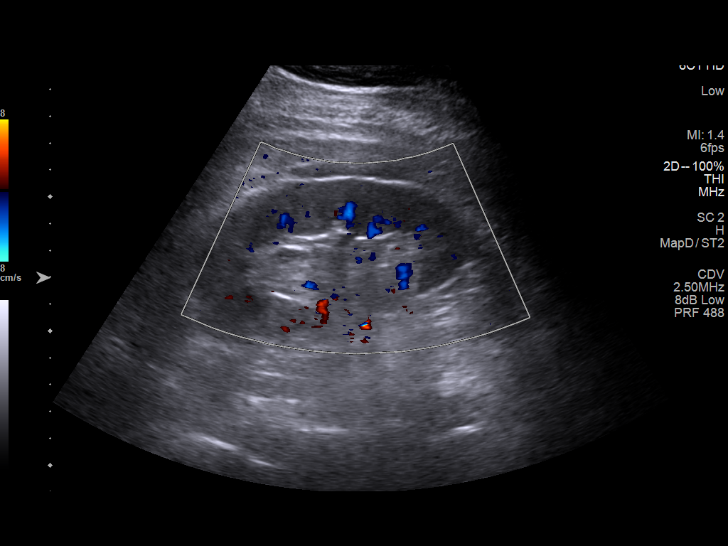
[im 25/37]
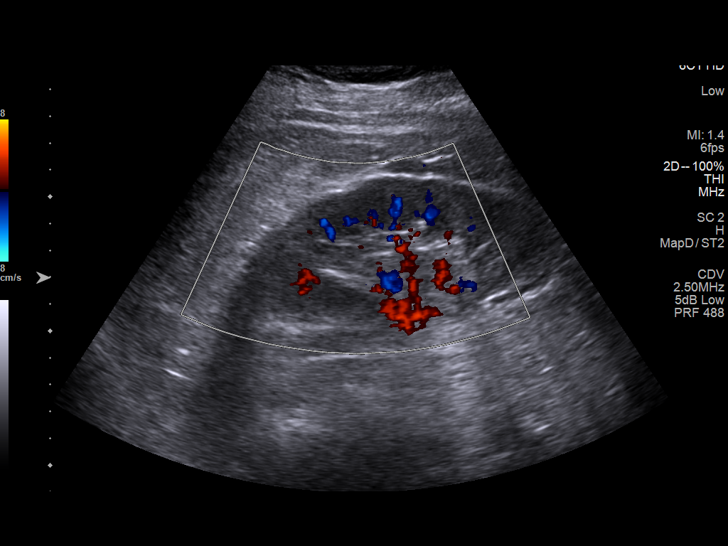
[im 28/37]
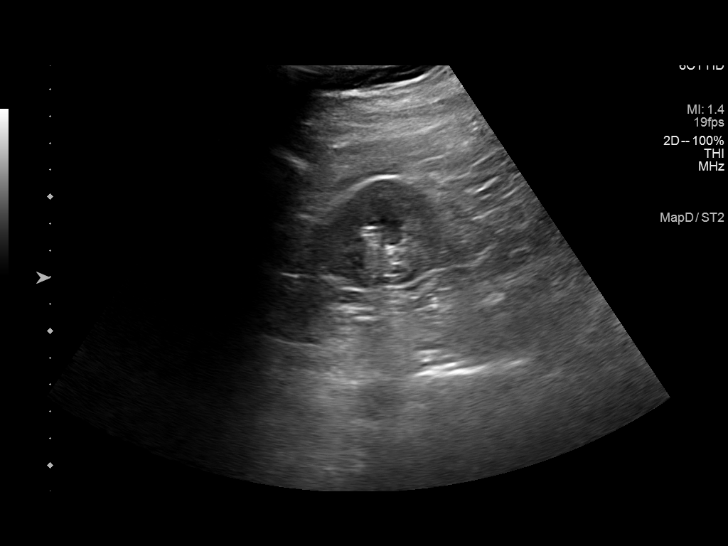
[im 31/37]
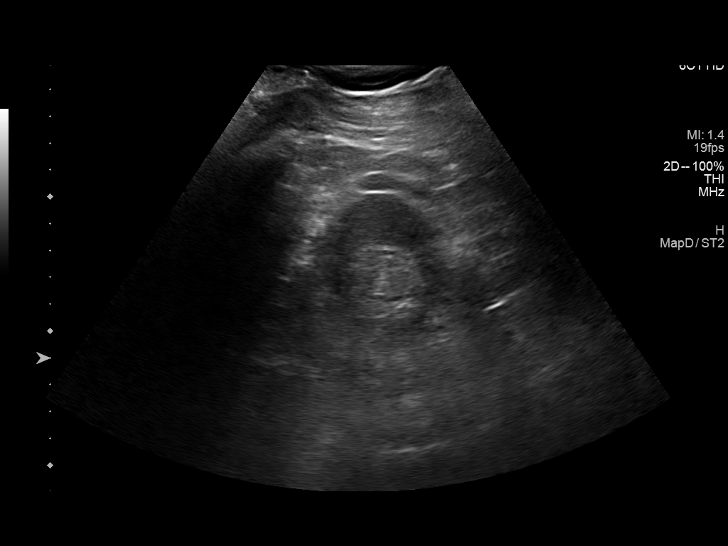
[im 34/37]
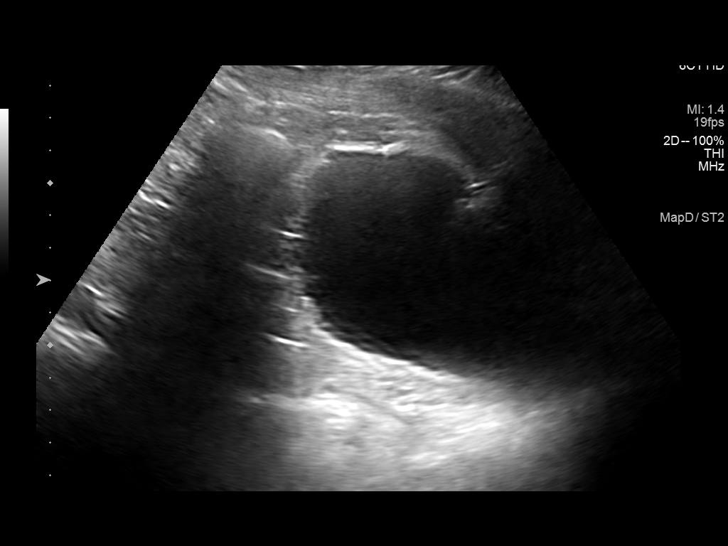
[im 37/37]
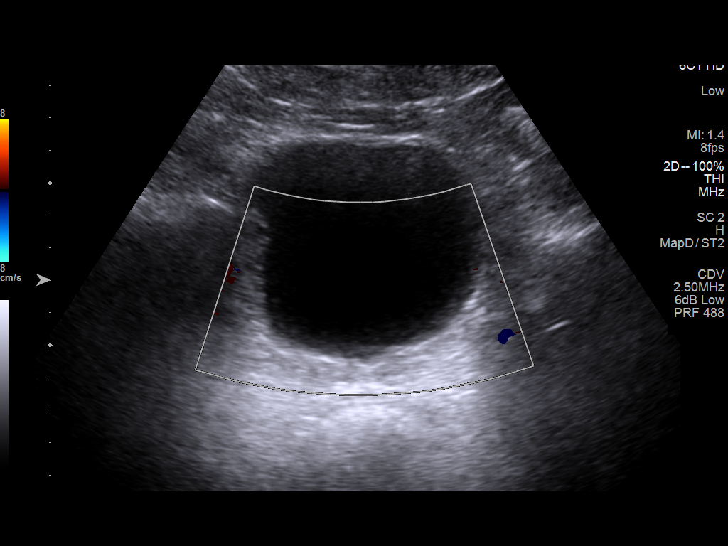

[14 of 25 positions shown; findings below may reference images not displayed]

FINDINGS: Right Kidney:

Renal measurements: 10.6 x 5.2 x 5.1 cm = volume: 146 mL. Mild
increase in cortical echogenicity with abundant renal sinus fat. No
cortical thinning. No mass or hydronephrosis. Normal color flow.

Left Kidney:

Renal measurements: 12.4 x 6.2 x 5.0 = volume: 200 mL. Mild increase
in cortical echogenicity with abundant renal sinus fat. No cortical
thinning. No mass or hydronephrosis. Normal color flow.

Bladder:

Appears normal for degree of bladder distention.
IMPRESSION: Increased renal echogenicity compatible with medical renal disease.
Otherwise unremarkable urinary tract ultrasound.

## 2020-10-24 ENCOUNTER — Other Ambulatory Visit: Payer: Self-pay

## 2020-10-24 MED ORDER — CARVEDILOL 3.125 MG PO TABS: 3.1250 mg | ORAL_TABLET | Freq: Two times a day (BID) | ORAL | 3 refills | Status: DC

## 2020-10-24 NOTE — Telephone Encounter (Signed)
Spoke to patient appointment scheduled with Dr.Jordan 6/1 at 10:20 am.

## 2020-10-27 NOTE — Progress Notes (Deleted)
Cardiology Office Note   Date:  10/27/2020   ID:  Robert Summers, DOB 10-Apr-1941, MRN [id number]  PCP:  Manfred Shirts, PA  Cardiologist:  Xylon Croom [place], MD EP: None  No chief complaint on file.     History of Present Illness: Robert Summers is a 80 y.o. male with PMH of paroxysmal atrial flutter s/p successful DCCV 07/2018, pre-DM type 2, GERD, and CKD stage 3 who presents for follow-up of his paroxysmal atrial flutter and HTN.  He was evaluated by cardiology via a telemedicine visit with Almyra Deforest, PA-C 10/11/2018, at which time he was without cardiac complaints and no medication changes occurred. Echo in March 2020 was grossly normal.    He was seen in follow up by [name] PA-C on 11/11/18 and was doing well except for elevated BP. Started on Coreg for BP. Initially started at 6.25 mg bid. This was later reduced to 3.125 mg bid.   On follow up he is doing very well. No recurrent arrhythmia. Has an Apple watch to check on it. No dizziness. BP is doing very well. He has focused on his carbohydrate intake since he was diagnosed with DM and has lost 17 lbs.    Past Medical History:  Diagnosis Date  . AKI (acute kidney injury) ([place]) 08/10/2018  . Arthritis    KNEES  . Atrial flutter ([place]) 08/10/2018  . Atrial flutter with rapid ventricular response ([place]) 08/09/2018  . Chest pain 08/09/2018  . GERD (gastroesophageal reflux disease)    OCCASIONAL ONLY  . H/O hiatal hernia   . History of gout   . History of nonmelanoma skin cancer   . Malignant neoplasm of prostate ([place]) 07/05/2013  . Prediabetes   . Prostate cancer [facility])     Past Surgical History:  Procedure Laterality Date  . CARDIOVERSION N/A 08/11/2018   Procedure: CARDIOVERSION;  Surgeon: Buford Dresser, MD;  Location: [facility] ENDOSCOPY;  Service: Cardiovascular;  Laterality: N/A;  . CIRCUMCISION    . ROBOT ASSISTED LAPAROSCOPIC RADICAL PROSTATECTOMY N/A 07/05/2013   Procedure: ROBOTIC ASSISTED LAPAROSCOPIC NERVE  SPARING RADICAL PROSTATECTOMY;  Surgeon: Irine Seal, MD;  Location: [facility] ORS;  Service: Urology;  Laterality: N/A;  . SPERMATACELE     REPAIRED  . WRIST SURGERY     RT     Current Outpatient Medications  Medication Sig Dispense Refill  . acetaminophen (TYLENOL) 500 MG tablet Take 500 mg by mouth every 6 (six) hours as needed for mild pain or moderate pain.    Marland Kitchen allopurinol (ZYLOPRIM) 100 MG tablet Take 100 mg by mouth 2 (two) times daily.    Marland Kitchen apixaban (ELIQUIS) 2.5 MG TABS tablet Take 1 tablet (2.5 mg total) by mouth 2 (two) times daily. 180 tablet 3  . carvedilol (COREG) 3.125 MG tablet Take 1 tablet (3.125 mg total) by mouth 2 (two) times daily. 180 tablet 3  . Cholecalciferol (VITAMIN D3) 50 MCG (2000 UT) capsule Take 2,000 Units by mouth daily.    . metFORMIN (GLUCOPHAGE-XR) 500 MG 24 hr tablet Take 500 mg by mouth every morning.    . Omega-3 Fatty Acids (FISH OIL) 1000 MG CAPS Take 1 capsule by mouth daily.    . vitamin B-12 (CYANOCOBALAMIN) 1000 MCG tablet Take 1,000 mcg by mouth daily.     No current facility-administered medications for this visit.    Allergies:   Lisinopril, Nsaids, and Latex    Social History:  The patient  reports that he has quit smoking.  He quit smokeless tobacco use about 58 years ago. He reports current alcohol use. He reports that he does not use drugs.   Family History:  The patient's family history includes AAA (abdominal aortic aneurysm) in his brother and sister; Arthritis in his father; CAD in his brother and father; Diabetes in his brother, brother, and mother; Hypertension in his brother and sister; Leukemia in his brother; Stroke in his mother.    ROS:  Please see the history of present illness.   Otherwise, review of systems are positive for none.   All other systems are reviewed and negative.    PHYSICAL EXAM: VS:  There were no vitals taken for this visit. , BMI There is no height or weight on file to calculate BMI. GEN: Well nourished,  well developed, in no acute distress HEENT: sclera anicteric Neck: no JVD, carotid bruits, or masses Cardiac: RRR; no murmurs, rubs, or gallops, trace LE edema  Respiratory:  clear to auscultation bilaterally, normal work of breathing GI: soft, nontender, nondistended, + BS MS: no deformity or atrophy Skin: warm and dry, no rash Neuro:  Strength and sensation are intact Psych: euthymic mood, full affect   EKG:  EKG is ordered today. NSR rate 59. First degree AV block. I have personally reviewed and interpreted this study.    Recent Labs: 12/26/2019: ALT 17 12/27/2019: BUN 32; Creatinine, Ser 1.64; Hemoglobin 13.2; Magnesium 2.0; Platelets 152; Potassium 3.7; Sodium 133    Lipid Panel No results found for: CHOL, TRIG, HDL, CHOLHDL, VLDL, LDLCALC, LDLDIRECT  Dated 06/08/16: cholesterol 175, triglycerides 216, HDL 36, LDL 96. A1c 6.8% Dated 08/09/19: Normal CBC, uric acid, TSH. A1c 7.7%. glucose 147 otherwise CMET normal. Cholesterol 173, triglycerides 210, HDL 38, LDL 93.   Wt Readings from Last 3 Encounters:  12/25/19 (!) 227 lb 15.3 oz (103.4 kg)  11/13/19 228 lb (103.4 kg)  05/19/19 245 lb (111.1 kg)      Other studies Reviewed: Additional studies/ records that were reviewed today include:   Echocardiogram 07/2018: IMPRESSIONS    1. The left ventricle has normal systolic function, with an ejection fraction of 55-60%. The cavity size was normal. Left ventricular diastolic function could not be evaluated due to nondiagnostic images.  2. Extremely limited views without use of contrast. Definity (echo contrast) shows grossly normal function, no clear wall motion abnormalities. Anteroseptum appears to have an area that appears thinner at rest, but it does contract.  3. The right ventricle has normal systolic function. The cavity was normal. There is no increase in right ventricular wall thickness. Right ventricular systolic pressure could not be assessed.  4. Left atrial size was  not well visualized.  5. The mitral valve is grossly normal.  6. The tricuspid valve is grossly normal.  7. The aortic valve is tricuspid Mild calcification of the aortic valve. Aortic valve regurgitation was not assessed by color flow Doppler.  SUMMARY   Extremely limited windows without use of echo contrast. With contrast, grossly normal LV function, no apparent WMA or significant valve disease. In atrial flutter throughout study.    ASSESSMENT AND PLAN:  1. Paroxysmal atrial flutter: s/p cardioversion 07/2018. No recurrent symptoms c/w aflutter presentation. Maintaining NSR - Continue eliquis  - on  low dose carvedilol   2. HTN: BP well controlled on low dose Coreg.   3. CKD stage 3: stable. Seen by Nephrology.    Current medicines are reviewed at length with the patient today.  The patient does not have  concerns regarding medicines.  The following changes have been made:  none  Labs/ tests ordered today include:   No orders of the defined types were placed in this encounter.    Disposition:   FU one year  Signed, Serenidy Waltz [place], MD  10/27/2020 4:26 PM

## 2020-10-29 ENCOUNTER — Ambulatory Visit (INDEPENDENT_AMBULATORY_CARE_PROVIDER_SITE_OTHER): Payer: Medicare Other | Admitting: Physician Assistant

## 2020-10-29 ENCOUNTER — Telehealth: Payer: Self-pay | Admitting: Cardiology

## 2020-10-29 ENCOUNTER — Encounter: Payer: Self-pay | Admitting: Physician Assistant

## 2020-10-29 ENCOUNTER — Other Ambulatory Visit: Payer: Self-pay

## 2020-10-29 VITALS — BP 128/62 | HR 57 | Resp 18 | Ht 70.0 in | Wt 228.0 lb

## 2020-10-29 DIAGNOSIS — R600 Localized edema: Secondary | ICD-10-CM | POA: Diagnosis not present

## 2020-10-29 DIAGNOSIS — R079 Chest pain, unspecified: Secondary | ICD-10-CM

## 2020-10-29 DIAGNOSIS — I480 Paroxysmal atrial fibrillation: Secondary | ICD-10-CM

## 2020-10-29 DIAGNOSIS — N183 Chronic kidney disease, stage 3 unspecified: Secondary | ICD-10-CM | POA: Diagnosis not present

## 2020-10-29 DIAGNOSIS — C8300 Small cell B-cell lymphoma, unspecified site: Secondary | ICD-10-CM

## 2020-10-29 NOTE — Patient Instructions (Addendum)
Medication Instructions:  Your physician recommends that you continue on your current medications as directed. Please refer to the Current Medication list given to you today.  *If you need a refill on your cardiac medications before your next appointment, please call your pharmacy*  Lab Work: Your physician recommends that you return for lab work TODAY:   BMET If you have labs (blood work) drawn today and your tests are completely normal, you will receive your results only by: Marland Kitchen MyChart Message (if you have MyChart) OR . A paper copy in the mail If you have any lab test that is abnormal or we need to change your treatment, we will call you to review the results.  Testing/Procedures: Your physician has requested that you have an echocardiogram. Echocardiography is a painless test that uses sound waves to create images of your heart. It provides your doctor with information about the size and shape of your heart and how well your heart's chambers and valves are working. This procedure takes approximately one hour. There are no restrictions for this procedure.   Please schedule for 1 week   Follow-Up: At [facility], you and your health needs are our priority.  As part of our continuing mission to provide you with exceptional heart care, we have created designated Provider Care Teams.  These Care Teams include your primary Cardiologist (physician) and Advanced Practice Providers (APPs -  Physician Assistants and Nurse Practitioners) who all work together to provide you with the care you need, when you need it.   Your next appointment:   As scheduled    The format for your next appointment:   In Person  Provider:   Peter Martinique, MD  Other Instructions

## 2020-10-29 NOTE — Telephone Encounter (Signed)
Pt c/o of Chest Pain: STAT if CP now or developed within 24 hours  1. Are you having CP right now? No   2. Are you experiencing any other symptoms (ex. SOB, nausea, vomiting, sweating)? Tightness on the side of his left breast   3. How long have you been experiencing CP? Since last night   4. Is your CP continuous or coming and going? Continuous   5. Have you taken Nitroglycerin? No ? PTS WIFE IS CALLING IN STATING THAT PT IS SAYING THAT HE HAS A TIGHTENING IN HIS CHEST THAT FEELS AS IF HE HAS A BAND AROUND HIS CHEST, IT FEELS LIKE HIS MUSCLE AND WHEN HE MOVES HIS LEFT ARM AROUND IT ACHES, CANNOT WAIT UNTIL TOMMOROW TO SEE DR. Martinique

## 2020-10-29 NOTE — Telephone Encounter (Signed)
Spoke to patient's wife she stated husband had chest tightness last night felt like a band around chest.No chest tightness at present.Stated he has appointment with Dr.Jordan tomorrow.She wanted to know if he could be seen today.Appointment scheduled with Almyra Deforest PA this afternoon at 2:45 pm.

## 2020-10-29 NOTE — Progress Notes (Signed)
Cardiology Office Note:    Date:  10/31/2020   ID:  Robert Summers, DOB April 14, 1941, MRN [id number]  PCP:  Manfred Shirts, PA   [facility] Providers Cardiologist:  Peter Martinique, MD {  Referring MD: Manfred Shirts, [place]   Chief Complaint  Patient presents with  . Chest Pain    History of Present Illness:    Robert Summers is a 80 y.o. male with a hx of prostate cancer in remission, CKD stage II-III, prediabetes, GERD, arthritis and PAF.  He was diagnosed with atrial fibrillation in 2020 when he developed chest pain for few hours and his PCP noted he was in atrial flutter with RVR and subsequently sent him to the emergency room.  He has no prior cardiac issues but did have a stress test in late 1990s which was normal.  He used to smoke for short time but quit at age 74.  He only has occasional glass of wine.  On arrival to the ED, he was found to be mildly hyperkalemic with potassium of 5.3.  Creatinine was mildly elevated at 1.65.  D-dimer was normal.  His chest discomfort was felt to be related to atrial flutter.  TSH was normal.  EKG on arrival demonstrated atrial flutter with heart rate of 115 bpm, however heart rate improved to 70s with no intervention.  Baseline heart rate per EKG in 2015 was 41, so it was felt patient likely would not tolerate beta-blocker.  Patient was initiated on Eliquis.  Echocardiogram obtained on 08/10/2018 showed EF 55 to 60%, no wall motion abnormality.  He underwent successful DC cardioversion x1 on 08/11/2018 prior to discharge.  Carvedilol was later added in June 2020 for elevated blood pressure, this was initially started 6.25 mg twice a day, later reduced to 3.25 mg twice a day.  Patient was last seen by Dr. Martinique in June 2021 at which time he was doing well.  Eliquis was decreased to 2.5 mg twice a day in January 2022.  He developed new lump in his left neck healing by the end of September 2021, ultrasound showed abnormal left supraclavicular bulky adenopathy  measuring up to 2.6 cm.  He was seen by ENT and had excisional left cervical lymph node biopsy on 03/20/2020, this revealed B-cell lymphoma, he was subsequently referred to oncology service.  He was started on Obinutuzumab and Ibrutinib on 05/29/2020.  Since starting on the new therapy, he has been having increasing lower extremity edema. Obinutuzumab was stopped after the last dose on 5/18. He was instructed to follow-up with cardiology service due to potential cardiac side effects from the medication.  Patient presents today for follow-up.  His main concern is lower extremity edema since starting on the new regimen.  I suspect this is related to Ibrutinib which can potentially cause heart failure.  Although Obinutuzumab can also have cardiac complication as well.  I discussed the case with DOD Dr. Harrell Gave, we recommended a echocardiogram with strain to assess ejection fraction.  At this time, I recommended salt restriction, fluid restriction and daily weight.  Depend on echocardiogram result, I may adjust his diuretic further. He will need a BMET.    Past Medical History:  Diagnosis Date  . AKI (acute kidney injury) ([place]) 08/10/2018  . Arthritis    KNEES  . Atrial flutter ([place]) 08/10/2018  . Atrial flutter with rapid ventricular response ([place]) 08/09/2018  . Chest pain 08/09/2018  . GERD (gastroesophageal reflux disease)    OCCASIONAL ONLY  .  H/O hiatal hernia   . History of gout   . History of nonmelanoma skin cancer   . Malignant neoplasm of prostate ([place]) 07/05/2013  . Prediabetes   . Prostate cancer [facility])     Past Surgical History:  Procedure Laterality Date  . CARDIOVERSION N/A 08/11/2018   Procedure: CARDIOVERSION;  Surgeon: Buford Dresser, MD;  Location: [facility] ENDOSCOPY;  Service: Cardiovascular;  Laterality: N/A;  . CIRCUMCISION    . ROBOT ASSISTED LAPAROSCOPIC RADICAL PROSTATECTOMY N/A 07/05/2013   Procedure: ROBOTIC ASSISTED LAPAROSCOPIC NERVE SPARING RADICAL PROSTATECTOMY;   Surgeon: Irine Seal, MD;  Location: [facility] ORS;  Service: Urology;  Laterality: N/A;  . SPERMATACELE     REPAIRED  . WRIST SURGERY     RT    Current Medications: Current Meds  Medication Sig  . acetaminophen (TYLENOL) 500 MG tablet Take 500 mg by mouth every 6 (six) hours as needed for mild pain or moderate pain.  Marland Kitchen allopurinol (ZYLOPRIM) 100 MG tablet Take 100 mg by mouth 2 (two) times daily.  Marland Kitchen apixaban (ELIQUIS) 2.5 MG TABS tablet Take 1 tablet (2.5 mg total) by mouth 2 (two) times daily.  . carvedilol (COREG) 3.125 MG tablet Take 1 tablet (3.125 mg total) by mouth 2 (two) times daily.  . Cholecalciferol (VITAMIN D3) 50 MCG (2000 UT) capsule Take 2,000 Units by mouth daily.  . colchicine 0.6 MG tablet Take 0.6 mg by mouth 2 (two) times daily. AS NEEDED ONLY  . furosemide (LASIX) 20 MG tablet Take 20 mg by mouth 2 (two) times daily.  Marland Kitchen ibrutinib (IMBRUVICA) 140 MG capsul Take 420 mg by mouth daily.  . Melatonin 1 MG CHEW Chew 3 mg by mouth at bedtime as needed.  . metFORMIN (GLUCOPHAGE-XR) 500 MG 24 hr tablet Take 500 mg by mouth every morning.  . vitamin B-12 (CYANOCOBALAMIN) 1000 MCG tablet Take 1,000 mcg by mouth daily.     Allergies:   Lisinopril, Nsaids, and Latex   Social History   Socioeconomic History  . Marital status: Married    Spouse name: Not on file  . Number of children: Not on file  . Years of education: Not on file  . Highest education level: Not on file  Occupational History  . Not on file  Tobacco Use  . Smoking status: Former Research scientist (life sciences)  . Smokeless tobacco: Former Systems developer    Quit date: 06/28/1962  Vaping Use  . Vaping Use: Never used  Substance and Sexual Activity  . Alcohol use: Yes    Comment: RARE  . Drug use: No  . Sexual activity: Not on file  Other Topics Concern  . Not on file  Social History Narrative  . Not on file   Social Determinants of Health   Financial Resource Strain: Not on file  Food Insecurity: Not on file  Transportation Needs: Not  on file  Physical Activity: Not on file  Stress: Not on file  Social Connections: Not on file     Family History: The patient's family history includes AAA (abdominal aortic aneurysm) in his brother and sister; Arthritis in his father; CAD in his brother and father; Diabetes in his brother, brother, and mother; Hypertension in his brother and sister; Leukemia in his brother; Stroke in his mother.  ROS:   Please see the history of present illness.     All other systems reviewed and are negative.  EKGs/Labs/Other Studies Reviewed:    The following studies were reviewed today:  Echo 08/10/2018 1. The left ventricle  has normal systolic function, with an ejection  fraction of 55-60%. The cavity size was normal. Left ventricular diastolic  function could not be evaluated due to nondiagnostic images.  2. Extremely limited views without use of contrast. Definity (echo  contrast) shows grossly normal function, no clear wall motion  abnormalities. Anteroseptum appears to have an area that appears thinner  at rest, but it does contract.  3. The right ventricle has normal systolic function. The cavity was  normal. There is no increase in right ventricular wall thickness. Right  ventricular systolic pressure could not be assessed.  4. Left atrial size was not well visualized.  5. The mitral valve is grossly normal.  6. The tricuspid valve is grossly normal.  7. The aortic valve is tricuspid Mild calcification of the aortic valve.  Aortic valve regurgitation was not assessed by color flow Doppler.    EKG:  EKG is ordered today.  The ekg ordered today demonstrates sinus bradycardia with a first-degree AV block  Recent Labs: 12/26/2019: ALT 17 12/27/2019: Hemoglobin 13.2; Magnesium 2.0; Platelets 152 10/29/2020: BUN 26; Creatinine, Ser 1.56; Potassium 4.1; Sodium 138  Recent Lipid Panel No results found for: CHOL, TRIG, HDL, CHOLHDL, VLDL, LDLCALC, LDLDIRECT   Risk  Assessment/Calculations:    CHA2DS2-VASc Score = 3  This indicates a 3.2% annual risk of stroke. The patient's score is based upon: CHF History: Yes HTN History: No Diabetes History: No Stroke History: No Vascular Disease History: No Age Score: 2 Gender Score: 0      Physical Exam:    VS:  BP 128/62 (BP Location: Left Arm, Patient Position: Sitting, Cuff Size: Normal)   Pulse (!) 57   Resp 18   Ht 5\' 10"  (1.778 m)   Wt 228 lb (103.4 kg)   SpO2 94%   BMI 32.71 kg/m     Wt Readings from Last 3 Encounters:  10/29/20 228 lb (103.4 kg)  12/25/19 (!) 227 lb 15.3 oz (103.4 kg)  11/13/19 228 lb (103.4 kg)     GEN:  Well nourished, well developed in no acute distress HEENT: Normal NECK: No JVD; No carotid bruits LYMPHATICS: No lymphadenopathy CARDIAC: RRR, no murmurs, rubs, gallops RESPIRATORY:  Clear to auscultation without rales, wheezing or rhonchi  ABDOMEN: Soft, non-tender, non-distended MUSCULOSKELETAL:  2-3+ edema; No deformity  SKIN: Warm and dry NEUROLOGIC:  Alert and oriented x 3 PSYCHIATRIC:  Normal affect   ASSESSMENT:    1. Leg edema   2. Stage 3 chronic kidney disease, unspecified whether stage 3a or 3b CKD ([place])   3. PAF (paroxysmal atrial fibrillation) ([place])   4. Small lymphocytic lymphoma ([place])    PLAN:    In order of problems listed above:  1. Bilateral lower extremity edema: Patient was previously started on Obinutuzumab and Ibrutinib on 05/29/2020, since that time he has been developing worsening bilateral lower extremity edema.  His nephrologist started him on Lasix since February, however lower extremity edema has not completely resolved.  More recently, Obinutuzumab has been completed and he was supposed to continue on Ibrutinib long term.  Both of this medication has cardiac complications, and Ibrutinib can potentially cause heart failure.  I discussed the case with DOD Dr. Harrell Gave, we recommend the patient proceed with echocardiogram with  strain to see if there is any medication induced cardiomyopathy.  I will obtain basic metabolic panel today.  Depend on the result of the echocardiogram, we may need to increase his diuretic dosage  2. CKD stage III:  Obtain basic metabolic panel today.  3. PAF: Continue Eliquis and carvedilol.  Currently maintaining sinus rhythm.  4. Small lymphocytic lymphoma: Followed by Dr. Cassell Clement of [facility] hematology/oncology        Medication Adjustments/Labs and Tests Ordered: Current medicines are reviewed at length with the patient today.  Concerns regarding medicines are outlined above.  Orders Placed This Encounter  Procedures  . Basic metabolic panel  . EKG 12-Lead  . ECHOCARDIOGRAM COMPLETE   No orders of the defined types were placed in this encounter.   Patient Instructions  Medication Instructions:  Your physician recommends that you continue on your current medications as directed. Please refer to the Current Medication list given to you today.  *If you need a refill on your cardiac medications before your next appointment, please call your pharmacy*  Lab Work: Your physician recommends that you return for lab work TODAY:   BMET If you have labs (blood work) drawn today and your tests are completely normal, you will receive your results only by: Marland Kitchen MyChart Message (if you have MyChart) OR . A paper copy in the mail If you have any lab test that is abnormal or we need to change your treatment, we will call you to review the results.  Testing/Procedures: Your physician has requested that you have an echocardiogram. Echocardiography is a painless test that uses sound waves to create images of your heart. It provides your doctor with information about the size and shape of your heart and how well your heart's chambers and valves are working. This procedure takes approximately one hour. There are no restrictions for this procedure.   Please schedule for 1 week    Follow-Up: At [facility], you and your health needs are our priority.  As part of our continuing mission to provide you with exceptional heart care, we have created designated Provider Care Teams.  These Care Teams include your primary Cardiologist (physician) and Advanced Practice Providers (APPs -  Physician Assistants and Nurse Practitioners) who all work together to provide you with the care you need, when you need it.   Your next appointment:   As scheduled    The format for your next appointment:   In Person  Provider:   Peter Martinique, MD  Other Instructions      Signed, Almyra Deforest, [place]  10/31/2020 11:10 PM    [place]

## 2020-10-30 ENCOUNTER — Ambulatory Visit: Payer: Medicare Other | Admitting: Cardiology

## 2020-10-30 LAB — BASIC METABOLIC PANEL
BUN/Creatinine Ratio: 17 (ref 10–24)
BUN: 26 mg/dL (ref 8–27)
CO2: 21 mmol/L (ref 20–29)
Calcium: 8.8 mg/dL (ref 8.6–10.2)
Chloride: 101 mmol/L (ref 96–106)
Creatinine, Ser: 1.56 mg/dL — ABNORMAL HIGH (ref 0.76–1.27)
Glucose: 149 mg/dL — ABNORMAL HIGH (ref 65–99)
Potassium: 4.1 mmol/L (ref 3.5–5.2)
Sodium: 138 mmol/L (ref 134–144)
eGFR: 44 mL/min/{1.73_m2} — ABNORMAL LOW (ref >59)

## 2020-10-31 ENCOUNTER — Encounter: Payer: Self-pay | Admitting: Physician Assistant

## 2020-11-01 ENCOUNTER — Telehealth: Payer: Self-pay | Admitting: *Deleted

## 2020-11-01 ENCOUNTER — Other Ambulatory Visit: Payer: Self-pay

## 2020-11-01 ENCOUNTER — Ambulatory Visit (HOSPITAL_BASED_OUTPATIENT_CLINIC_OR_DEPARTMENT_OTHER)
Admission: RE | Admit: 2020-11-01 | Discharge: 2020-11-01 | Disposition: A | Discharge status: Home / Self Care | Payer: Medicare Other | Source: Ambulatory Visit | Attending: Physician Assistant | Admitting: Physician Assistant

## 2020-11-01 DIAGNOSIS — R600 Localized edema: Secondary | ICD-10-CM

## 2020-11-01 LAB — ECHOCARDIOGRAM COMPLETE
Area-P 1/2: 3.08 cm2
P 1/2 time: 626 msec
S' Lateral: 2.33 cm

## 2020-11-01 MED ORDER — FUROSEMIDE 20 MG PO TABS: 40.0000 mg | ORAL_TABLET | Freq: Two times a day (BID) | ORAL | 3 refills | Status: DC

## 2020-11-01 NOTE — Telephone Encounter (Addendum)
-----   Message from [place], [place] sent at 11/01/2020  4:11 PM EDT ----- Normal pumping function of heart, mild aortic valve leakage, mild aneurysmal change of aortic root, not large enough to require surgery. Right chamber pressure mildly elevated. Reassuring result. Recommend increase lasix to 40mg  BID, repeat BMET 1 week after starting on the higher dose of diuretic. Bring weight diary to the next visit.    Spoke with Robert Summers, aware of results New script sent to the pharmacy  Lab orders mailed to the Robert Summers

## 2020-11-08 LAB — BASIC METABOLIC PANEL
BUN/Creatinine Ratio: 15 (ref 10–24)
BUN: 25 mg/dL (ref 8–27)
CO2: 25 mmol/L (ref 20–29)
Calcium: 9.2 mg/dL (ref 8.6–10.2)
Chloride: 96 mmol/L (ref 96–106)
Creatinine, Ser: 1.69 mg/dL — ABNORMAL HIGH (ref 0.76–1.27)
Glucose: 200 mg/dL — ABNORMAL HIGH (ref 65–99)
Potassium: 4.1 mmol/L (ref 3.5–5.2)
Sodium: 136 mmol/L (ref 134–144)
eGFR: 40 mL/min/{1.73_m2} — ABNORMAL LOW (ref >59)

## 2020-11-08 NOTE — Progress Notes (Signed)
I have called and discussed lab result the patient's wife.

## 2020-11-25 NOTE — Progress Notes (Signed)
Cardiology Office Note   Date:  11/29/2020   ID:  Robert Summers, DOB 1940-07-31, MRN [id number]  PCP:  Robert Shirts, PA  Cardiologist:  Robert Nehring Martinique, MD EP: None  No chief complaint on file.     History of Present Illness: Robert Summers is a 80 y.o. male with PMH of paroxysmal atrial flutter s/p successful DCCV 07/2018, pre-DM type 2, GERD, and CKD stage 3 who presents for follow-up of his paroxysmal atrial flutter and HTN.  He was evaluated by cardiology via a telemedicine visit with Robert Deforest, PA-C 10/11/2018, at which time he was without cardiac complaints and no medication changes occurred. Echo in March 2020 was grossly normal.    He was seen in follow up by Robert Lofts PA-C on 11/11/18 and was doing well except for elevated BP. Started on Coreg for BP. Initially started at 6.25 mg bid. This was later reduced to 3.125 mg bid.   When last seen by me in 2021 he was doing well but had developed DM.   Eliquis was decreased to 2.5 mg twice a day in January 2022 based on renal function and age.  He developed new lump in his left neck healing by the end of September 2021, ultrasound showed abnormal left supraclavicular bulky adenopathy measuring up to 2.6 cm.  He was seen by ENT and had excisional left cervical lymph node biopsy on 03/20/2020, this revealed B-cell lymphoma, he was subsequently referred to oncology service.  He was started on Obinutuzumab and Ibrutinib on 05/29/2020.  Since starting on the new therapy, he has been having increasing lower extremity edema. Obinutuzumab was stopped after the last dose on 5/18. HHe was seen by Robert Deforest PA-C in May. Echo was done showing Normal LV function. Mild AI. Mild aortic root enlargement. No real cause for edema. Lasix was increased to bid. He did this with improvement in edema. Lasix later reduced to 40 mg bid alternating with 40 mg in am and 20 mg in pm. Followed by Nephrology. He states swelling is better. Weight at home stable  between 218-220 lbs. He often uses compression hose.    Past Medical History:  Diagnosis Date   AKI (acute kidney injury) ([place]) 08/10/2018   Arthritis    KNEES   Atrial flutter ([place]) 08/10/2018   Atrial flutter with rapid ventricular response ([place]) 08/09/2018   Chest pain 08/09/2018   GERD (gastroesophageal reflux disease)    OCCASIONAL ONLY   H/O hiatal hernia    History of gout    History of nonmelanoma skin cancer    Malignant neoplasm of prostate ([place]) 07/05/2013   Prediabetes    Prostate cancer [facility])     Past Surgical History:  Procedure Laterality Date   CARDIOVERSION N/A 08/11/2018   Procedure: CARDIOVERSION;  Surgeon: Robert Dresser, MD;  Location: [place];  Service: Cardiovascular;  Laterality: N/A;   CIRCUMCISION     ROBOT ASSISTED LAPAROSCOPIC RADICAL PROSTATECTOMY N/A 07/05/2013   Procedure: ROBOTIC ASSISTED LAPAROSCOPIC NERVE [place];  Surgeon: Robert Seal, MD;  Location: [facility];  Service: Urology;  Laterality: N/A;   SPERMATACELE     REPAIRED   WRIST SURGERY     RT     Current Outpatient Medications  Medication Sig Dispense Refill   acetaminophen (TYLENOL) 500 MG tablet Take 500 mg by mouth every 6 (six) hours as needed for mild pain or moderate pain.     allopurinol (ZYLOPRIM) 100 MG tablet Take 100 mg  by mouth 2 (two) times daily.     apixaban (ELIQUIS) 2.5 MG TABS tablet Take 1 tablet (2.5 mg total) by mouth 2 (two) times daily. 180 tablet 3   carvedilol (COREG) 3.125 MG tablet Take 1 tablet (3.125 mg total) by mouth 2 (two) times daily. 180 tablet 3   Cholecalciferol (VITAMIN D3) 50 MCG (2000 UT) capsule Take 2,000 Units by mouth daily.     colchicine 0.6 MG tablet Take 0.6 mg by mouth 2 (two) times daily. AS NEEDED ONLY     furosemide (LASIX) 20 MG tablet Take 2 tablets(40 mg) in the morning and 1 tablet(20 mg) in the evening     ibrutinib (IMBRUVICA) 140 MG capsul Take 420 mg by mouth daily.     metFORMIN (GLUCOPHAGE-XR) 500 MG  24 hr tablet Take 500 mg by mouth every morning.     vitamin B-12 (CYANOCOBALAMIN) 1000 MCG tablet Take 1,000 mcg by mouth daily.     No current facility-administered medications for this visit.    Allergies:   Lisinopril, Nsaids, and Latex    Social History:  The patient  reports that he has quit smoking. He quit smokeless tobacco use about 58 years ago. He reports current alcohol use. He reports that he does not use drugs.   Family History:  The patient's family history includes AAA (abdominal aortic aneurysm) in his brother and sister; Arthritis in his father; CAD in his brother and father; Diabetes in his brother, brother, and mother; Hypertension in his brother and sister; Leukemia in his brother; Stroke in his mother.    ROS:  Please see the history of present illness.   Otherwise, review of systems are positive for none.   All other systems are reviewed and negative.    PHYSICAL EXAM: VS:  BP 138/70   Pulse (!) 58   Ht 5\' 9"  (1.753 m)   Wt 226 lb 9.6 oz (102.8 kg)   SpO2 95%   BMI 33.46 kg/m  , BMI Body mass index is 33.46 kg/m. GEN: Well nourished, well developed, in no acute distress HEENT: sclera anicteric Neck: no JVD, carotid bruits, or masses Cardiac: RRR; no murmurs, rubs, or gallops, 1+ LE edema  Respiratory:  clear to auscultation bilaterally, normal work of breathing GI: soft, nontender, nondistended, + BS MS: no deformity or atrophy Skin: warm and dry, no rash Neuro:  Strength and sensation are intact Psych: euthymic mood, full affect   EKG:  EKG is not ordered today.   Recent Labs: 12/26/2019: ALT 17 12/27/2019: Hemoglobin 13.2; Magnesium 2.0; Platelets 152 11/08/2020: BUN 25; Creatinine, Ser 1.69; Potassium 4.1; Sodium 136    Lipid Panel No results found for: CHOL, TRIG, HDL, CHOLHDL, VLDL, LDLCALC, LDLDIRECT  Dated 06/08/16: cholesterol 175, triglycerides 216, HDL 36, LDL 96. A1c 6.8% Dated 08/09/19: Normal CBC, uric acid, TSH. A1c 7.7%. glucose 147  otherwise CMET normal. Cholesterol 173, triglycerides 210, HDL 38, LDL 93.   Wt Readings from Last 3 Encounters:  11/29/20 226 lb 9.6 oz (102.8 kg)  10/29/20 228 lb (103.4 kg)  12/25/19 (!) 227 lb 15.3 oz (103.4 kg)      Other studies Reviewed: Additional studies/ records that were reviewed today include:   Echocardiogram 07/2018: IMPRESSIONS      1. The left ventricle has normal systolic function, with an ejection fraction of 55-60%. The cavity size was normal. Left ventricular diastolic function could not be evaluated due to nondiagnostic images.  2. Extremely limited views without use of contrast.  Definity (echo contrast) shows grossly normal function, no clear wall motion abnormalities. Anteroseptum appears to have an area that appears thinner at rest, but it does contract.  3. The right ventricle has normal systolic function. The cavity was normal. There is no increase in right ventricular wall thickness. Right ventricular systolic pressure could not be assessed.  4. Left atrial size was not well visualized.  5. The mitral valve is grossly normal.  6. The tricuspid valve is grossly normal.  7. The aortic valve is tricuspid Mild calcification of the aortic valve. Aortic valve regurgitation was not assessed by color flow Doppler.   SUMMARY   Extremely limited windows without use of echo contrast. With contrast, grossly normal LV function, no apparent WMA or significant valve disease. In atrial flutter throughout study.  Echo 11/01/20: IMPRESSIONS     1. Left ventricular ejection fraction, by estimation, is 60 to 65%. The  left ventricle has normal function. The left ventricle has no regional  wall motion abnormalities.   2. The aortic valve is normal in structure. Aortic valve regurgitation is  mild. Mild aortic valve sclerosis is present, with no evidence of aortic  valve stenosis.   3. Aneurysm of the ascending aorta, measuring 42 mm.   4. The inferior vena cava is normal in  size with greater than 50%  respiratory variability, suggesting right atrial pressure of 3 mmHg.   ASSESSMENT AND PLAN:  Bilateral lower extremity edema: Patient was previously started on Obinutuzumab and Ibrutinib on 05/29/2020, since that time he has been developing worsening bilateral lower extremity edema.  Obinutuzumab has been completed and he was supposed to continue on Ibrutinib long term. No evidence of cardiac cause for swelling based on Echo. I am happy with current dose of diuretics. Continue compression hose, elevation of feet when possible and restriction of sodium intake.     CKD stage III: per Nephrology   PAF: Continue Eliquis and carvedilol.  Currently maintaining sinus rhythm.   Small lymphocytic lymphoma: Followed by Dr. Cassell Clement of [facility]oncology    Current medicines are reviewed at length with the patient today.  The patient does not have concerns regarding medicines.  The following changes have been made:  none  Labs/ tests ordered today include:   No orders of the defined types were placed in this encounter.    Disposition:   FU 6 months with APP  Signed, Tishina Lown Martinique, MD  11/29/2020 3:08 PM

## 2020-11-29 ENCOUNTER — Encounter: Payer: Self-pay | Admitting: Cardiology

## 2020-11-29 ENCOUNTER — Other Ambulatory Visit: Payer: Self-pay

## 2020-11-29 ENCOUNTER — Ambulatory Visit (INDEPENDENT_AMBULATORY_CARE_PROVIDER_SITE_OTHER): Payer: Medicare Other | Admitting: Cardiology

## 2020-11-29 VITALS — BP 138/70 | HR 58 | Ht 69.0 in | Wt 226.6 lb

## 2020-11-29 DIAGNOSIS — C8300 Small cell B-cell lymphoma, unspecified site: Secondary | ICD-10-CM

## 2020-11-29 DIAGNOSIS — I10 Essential (primary) hypertension: Secondary | ICD-10-CM

## 2020-11-29 DIAGNOSIS — I480 Paroxysmal atrial fibrillation: Secondary | ICD-10-CM | POA: Diagnosis not present

## 2020-11-29 DIAGNOSIS — N183 Chronic kidney disease, stage 3 unspecified: Secondary | ICD-10-CM

## 2020-11-29 DIAGNOSIS — R600 Localized edema: Secondary | ICD-10-CM | POA: Diagnosis not present

## 2021-06-17 ENCOUNTER — Ambulatory Visit: Payer: Medicare Other | Admitting: Physician Assistant

## 2021-06-17 ENCOUNTER — Other Ambulatory Visit: Payer: Self-pay

## 2021-06-17 ENCOUNTER — Encounter: Payer: Self-pay | Admitting: Physician Assistant

## 2021-06-17 VITALS — BP 138/82 | HR 65 | Ht 70.0 in | Wt 213.0 lb

## 2021-06-17 DIAGNOSIS — N183 Chronic kidney disease, stage 3 unspecified: Secondary | ICD-10-CM | POA: Diagnosis not present

## 2021-06-17 DIAGNOSIS — R600 Localized edema: Secondary | ICD-10-CM

## 2021-06-17 DIAGNOSIS — I480 Paroxysmal atrial fibrillation: Secondary | ICD-10-CM | POA: Diagnosis not present

## 2021-06-17 NOTE — Progress Notes (Signed)
Cardiology Office Note:    Date:  06/19/2021   ID:  Robert Summers, DOB 06-Nov-1940, MRN [id number]  PCP:  Manfred Shirts, [place] [facility] Cardiologist:  Peter Martinique, MD     Referring MD: Manfred Shirts, [place]   Chief Complaint  Patient presents with   Follow-up    Seen for Dr. Martinique    History of Present Illness:    Robert Summers is a 81 y.o. male with a hx of prostate cancer in remission, CKD stage II-III, prediabetes, GERD, arthritis and PAF. He has no prior cardiac issues but did have a stress test in late 1990s which was normal.  He used to smoke for short time but quit at age 48.  He was diagnosed with atrial fibrillation in 2020 when he developed chest pain for few hours and his PCP noted he was in atrial flutter with RVR and subsequently sent him to the emergency room. On arrival to the ED, he was found to be mildly hyperkalemic with potassium of 5.3.  Creatinine was mildly elevated at 1.65.  D-dimer was normal.  His chest discomfort was felt to be related to atrial flutter.  TSH was normal.  Patient was initiated on Eliquis.  Echocardiogram obtained on 08/10/2018 showed EF 55 to 60%, no wall motion abnormality.  He underwent successful DC cardioversion x1 on 08/11/2018 prior to discharge.  Carvedilol was later added in June 2020 for elevated blood pressure, this was initially started 6.25 mg twice a day, later reduced to 3.25 mg twice a day. Eliquis was decreased to 2.5 mg twice a day in January 2022.  He developed new lump in his left neck by the end of September 2021, ultrasound showed abnormal left supraclavicular bulky adenopathy measuring up to 2.6 cm.  He was seen by ENT and had excisional left cervical lymph node biopsy on 03/20/2020, this revealed B-cell lymphoma, he was subsequently referred to oncology service.  He was started on Obinutuzumab and Ibrutinib on 05/29/2020.  Since starting on the new therapy, he has been having increasing lower extremity edema.  Obinutuzumab was stopped after the last dose on 10/16/2020.   I saw the patient in May 2022 due to lower extremity edema after starting on chemotherapy.  Subsequent echocardiogram showed normal EF, mild AI, mild aortic root enlargement.  Lasix was increased to twice a day then eventually decreased to 40 mg twice daily alternating with 40 a.m. and 20 p.m. due to renal function.  He was last seen by Dr. Martinique in July 2022 at which time he was doing well.  Unfortunately, since the last visit, patient has been diagnosed with COVID-19 in October 2022 and has been followed by pulmonology service at [facility] for cryptogenic organizing pneumonia.  Patient finished a 5-day course of IV remdesivir, initially improved however returned to the hospital near the end of October with persistent fever and treated for hospital-acquired pneumonia with antibiotic.  He was discharged on 6 L of oxygen, eventually went down to 4 L.  He has also been treated with IVIG as well.  He has finished outpatient course of prednisone.  Patient presents today for follow-up.  He is breathing has improved.  He denies any recent chest discomfort.  He has no significant orthopnea or PND.  Blood pressure seems to be very well controlled on the current therapy.  He mentions he is Lasix has been scaled back to 40 mg daily.  Past Medical History:  Diagnosis Date  AKI (acute kidney injury) ([place]) 08/10/2018   Arthritis    KNEES   Atrial flutter ([place]) 08/10/2018   Atrial flutter with rapid ventricular response ([place]) 08/09/2018   Chest pain 08/09/2018   GERD (gastroesophageal reflux disease)    OCCASIONAL ONLY   H/O hiatal hernia    History of gout    History of nonmelanoma skin cancer    Malignant neoplasm of prostate ([place]) 07/05/2013   Prediabetes    Prostate cancer [facility])     Past Surgical History:  Procedure Laterality Date   CARDIOVERSION N/A 08/11/2018   Procedure: CARDIOVERSION;  Surgeon: Buford Dresser, MD;  Location: [place];  Service: Cardiovascular;  Laterality: N/A;   CIRCUMCISION     ROBOT ASSISTED LAPAROSCOPIC RADICAL PROSTATECTOMY N/A 07/05/2013   Procedure: ROBOTIC ASSISTED [place];  Surgeon: Irine Seal, MD;  [facility];  Service: Urology;  Laterality: N/A;   SPERMATACELE     REPAIRED   WRIST SURGERY     RT    Current Medications: Current Meds  Medication Sig   acetaminophen (TYLENOL) 500 MG tablet Take 500 mg by mouth every 6 (six) hours as needed for mild pain or moderate pain.   allopurinol (ZYLOPRIM) 100 MG tablet Take 100 mg by mouth 2 (two) times daily.   apixaban (ELIQUIS) 2.5 MG TABS tablet Take 1 tablet (2.5 mg total) by mouth 2 (two) times daily.   carvedilol (COREG) 3.125 MG tablet Take 1 tablet (3.125 mg total) by mouth 2 (two) times daily.   Cholecalciferol (VITAMIN D3) 50 MCG (2000 UT) capsule Take 2,000 Units by mouth daily.   colchicine 0.6 MG tablet Take 0.6 mg by mouth 2 (two) times daily. AS NEEDED ONLY   furosemide (LASIX) 20 MG tablet Take 40 mg by mouth daily. Take 2 tablets(40 mg) by mouth daily   metFORMIN (GLUCOPHAGE-XR) 500 MG 24 hr tablet Take 500 mg by mouth every morning.   vitamin B-12 (CYANOCOBALAMIN) 1000 MCG tablet Take 1,000 mcg by mouth daily.     Allergies:   Lisinopril, Nsaids, and Latex   Social History   Socioeconomic History   Marital status: Married    Spouse name: Not on file   Number of children: Not on file   Years of education: Not on file   Highest education level: Not on file  Occupational History   Not on file  Tobacco Use   Smoking status: Former   Smokeless tobacco: Former    Quit date: 06/28/1962  Vaping Use   Vaping Use: Never used  Substance and Sexual Activity   Alcohol use: Yes    Comment: RARE   Drug use: No   Sexual activity: Not on file  Other Topics Concern   Not on file  Social History Narrative   Not on file   Social Determinants of Health   Financial Resource  Strain: Not on file  Food Insecurity: Not on file  Transportation Needs: Not on file  Physical Activity: Not on file  Stress: Not on file  Social Connections: Not on file     Family History: The patient's family history includes AAA (abdominal aortic aneurysm) in his brother and sister; Arthritis in his father; CAD in his brother and father; Diabetes in his brother, brother, and mother; Hypertension in his brother and sister; Leukemia in his brother; Stroke in his mother.  ROS:   Please see the history of present illness.     All other systems reviewed and are  negative.  EKGs/Labs/Other Studies Reviewed:    The following studies were reviewed today:  Echo 11/01/2020  1. Left ventricular ejection fraction, by estimation, is 60 to 65%. The  left ventricle has normal function. The left ventricle has no regional  wall motion abnormalities.   2. The aortic valve is normal in structure. Aortic valve regurgitation is  mild. Mild aortic valve sclerosis is present, with no evidence of aortic  valve stenosis.   3. Aneurysm of the ascending aorta, measuring 42 mm.   4. The inferior vena cava is normal in size with greater than 50%  respiratory variability, suggesting right atrial pressure of 3 mmHg.   EKG:  EKG is ordered today.  The ekg ordered today demonstrates normal sinus rhythm, no significant ST-T wave changes  Recent Labs: 11/08/2020: BUN 25; Creatinine, Ser 1.69; Potassium 4.1; Sodium 136  Recent Lipid Panel No results found for: CHOL, TRIG, HDL, CHOLHDL, VLDL, LDLCALC, LDLDIRECT   Risk Assessment/Calculations:    CHA2DS2-VASc Score = 3   This indicates a 3.2% annual risk of stroke. The patient's score is based upon: CHF History: 1 HTN History: 0 Diabetes History: 0 Stroke History: 0 Vascular Disease History: 0 Age Score: 2 Gender Score: 0          Physical Exam:    VS:  BP 138/82    Pulse 65    Ht 5\' 10"  (1.778 m)    Wt 213 lb (96.6 kg)    SpO2 98%    BMI 30.56  kg/m     Wt Readings from Last 3 Encounters:  06/17/21 213 lb (96.6 kg)  11/29/20 226 lb 9.6 oz (102.8 kg)  10/29/20 228 lb (103.4 kg)     GEN:  Well nourished, well developed in no acute distress HEENT: Normal NECK: No JVD; No carotid bruits LYMPHATICS: No lymphadenopathy CARDIAC: RRR, no murmurs, rubs, gallops RESPIRATORY:  Clear to auscultation without rales, wheezing or rhonchi  ABDOMEN: Soft, non-tender, non-distended MUSCULOSKELETAL:  No edema; No deformity  SKIN: Warm and dry NEUROLOGIC:  Alert and oriented x 3 PSYCHIATRIC:  Normal affect   ASSESSMENT:    1. PAF (paroxysmal atrial fibrillation) (HCC)   2. Stage 3 chronic kidney disease, unspecified whether stage 3a or 3b CKD (HCC)   3. Leg edema    PLAN:    In order of problems listed above:  PAF: Currently maintaining sinus rhythm.  Continue Eliquis and carvedilol.  Stage III CKD: Stable on last lab work.  Leg edema: Continue current dose of Lasix.  Leg elevation and salt restriction.           Medication Adjustments/Labs and Tests Ordered: Current medicines are reviewed at length with the patient today.  Concerns regarding medicines are outlined above.  Orders Placed This Encounter  Procedures   EKG 12-Lead   No orders of the defined types were placed in this encounter.   Patient Instructions  Medication Instructions:  Your physician recommends that you continue on your current medications as directed. Please refer to the Current Medication list given to you today.  *If you need a refill on your cardiac medications before your next appointment, please call your pharmacy*  Lab Work: NONE ordered at this time of appointment   If you have labs (blood work) drawn today and your tests are completely normal, you will receive your results only by: [place] (if you have MyChart) OR A paper copy in the mail If you have any lab test that is abnormal  or we need to change your treatment, we will call  you to review the results.  Testing/Procedures: NONE ordered at this time of appointment   Follow-Up: At [facility], you and your health needs are our priority.  As part of our continuing mission to provide you with exceptional heart care, we have created designated Provider Care Teams.  These Care Teams include your primary Cardiologist (physician) and Advanced Practice Providers (APPs -  Physician Assistants and Nurse Practitioners) who all work together to provide you with the care you need, when you need it.  Your next appointment:   6 month(s)  The format for your next appointment:   In Person  Provider:   Peter Martinique, MD    Other Instructions     Signed, Almyra Deforest, [place]  06/19/2021 10:44 PM    [place]

## 2021-06-17 NOTE — Patient Instructions (Signed)

## 2021-06-19 ENCOUNTER — Encounter: Payer: Self-pay | Admitting: Physician Assistant

## 2021-08-22 IMAGING — CR DG CHEST 2V
2 series · 2 of 2 positions shown · non-contrast
Comparison: 11/25/2019

CLINICAL DATA: Fever and pneumonia evaluation.

EXAM:
CHEST - 2 VIEW

[chest pa]
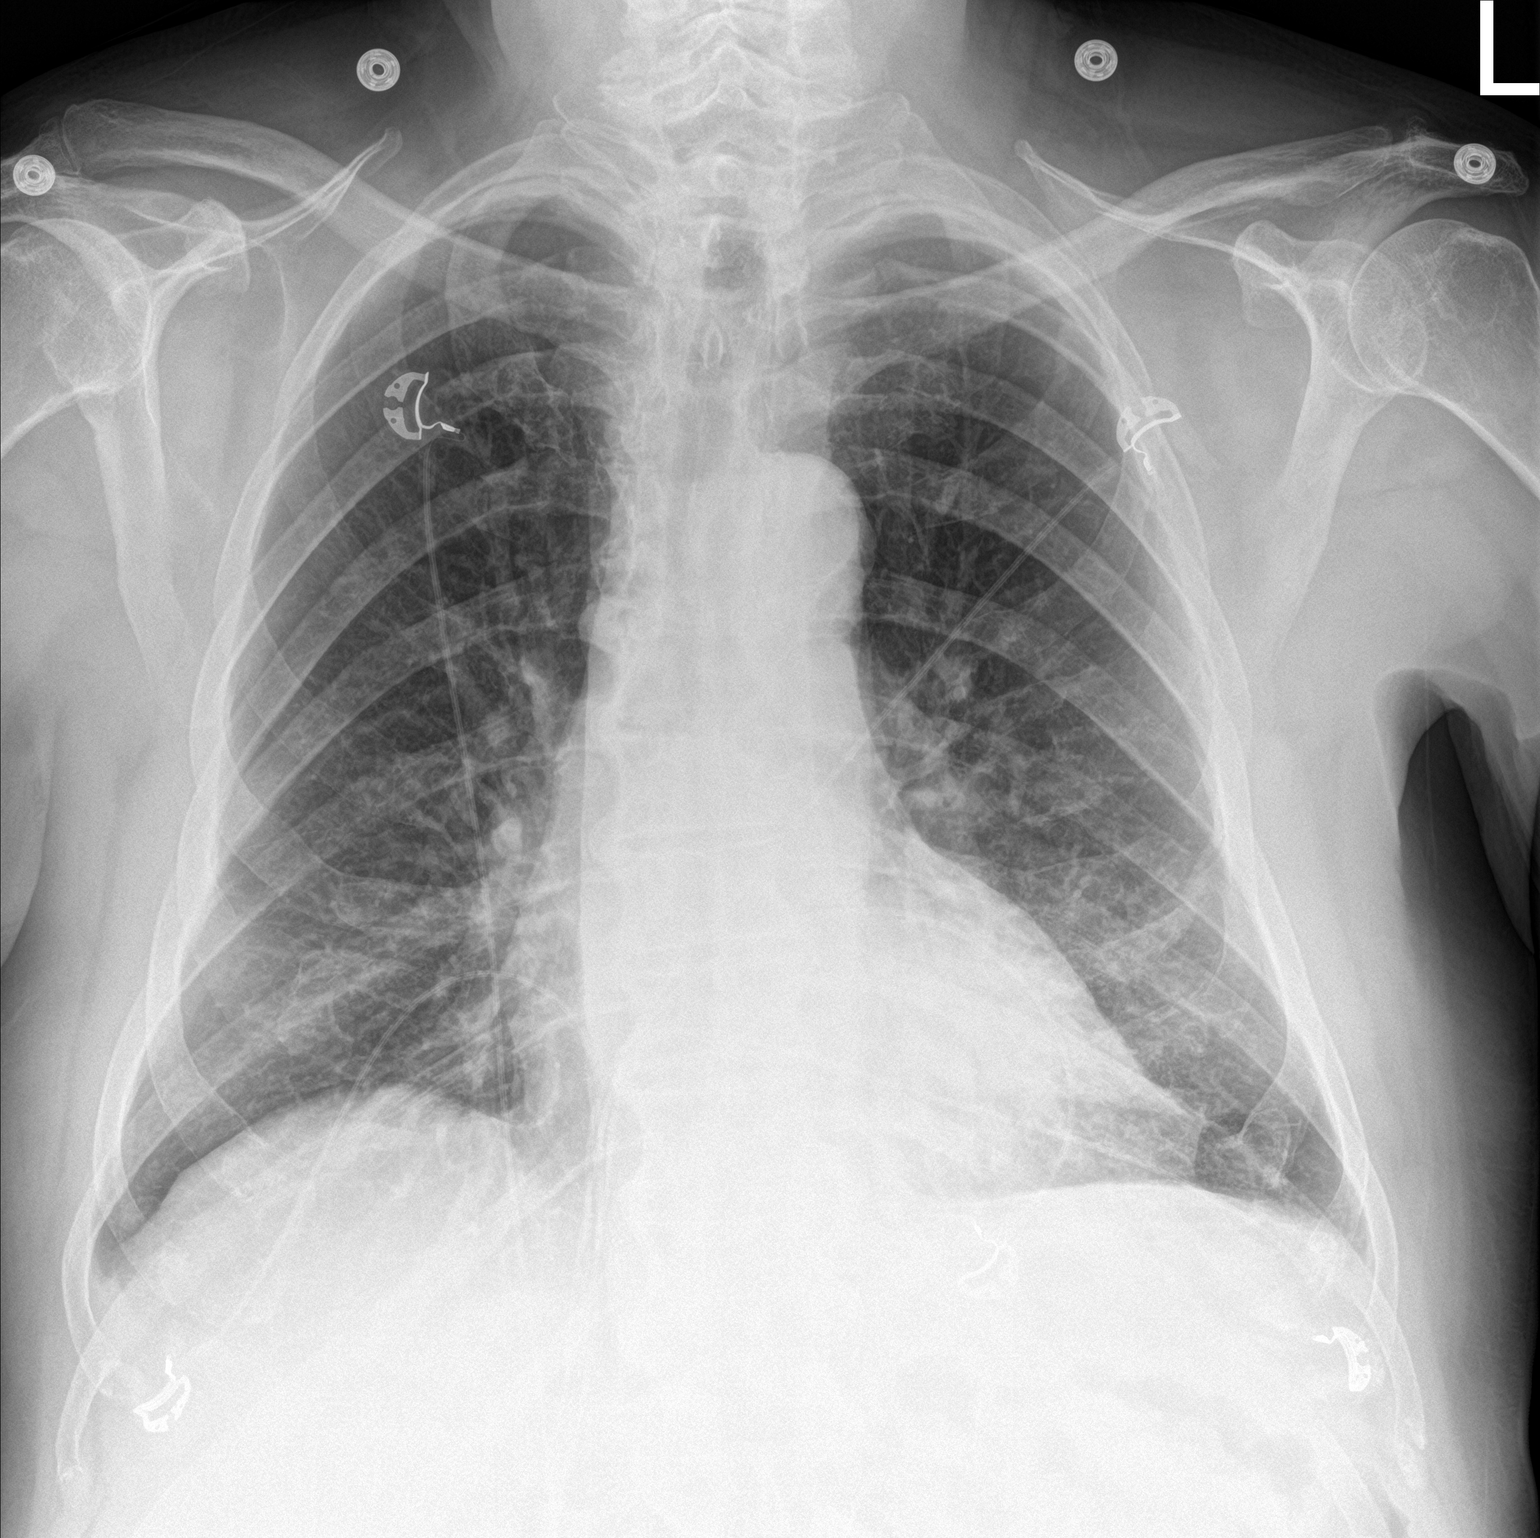

[chest lat]
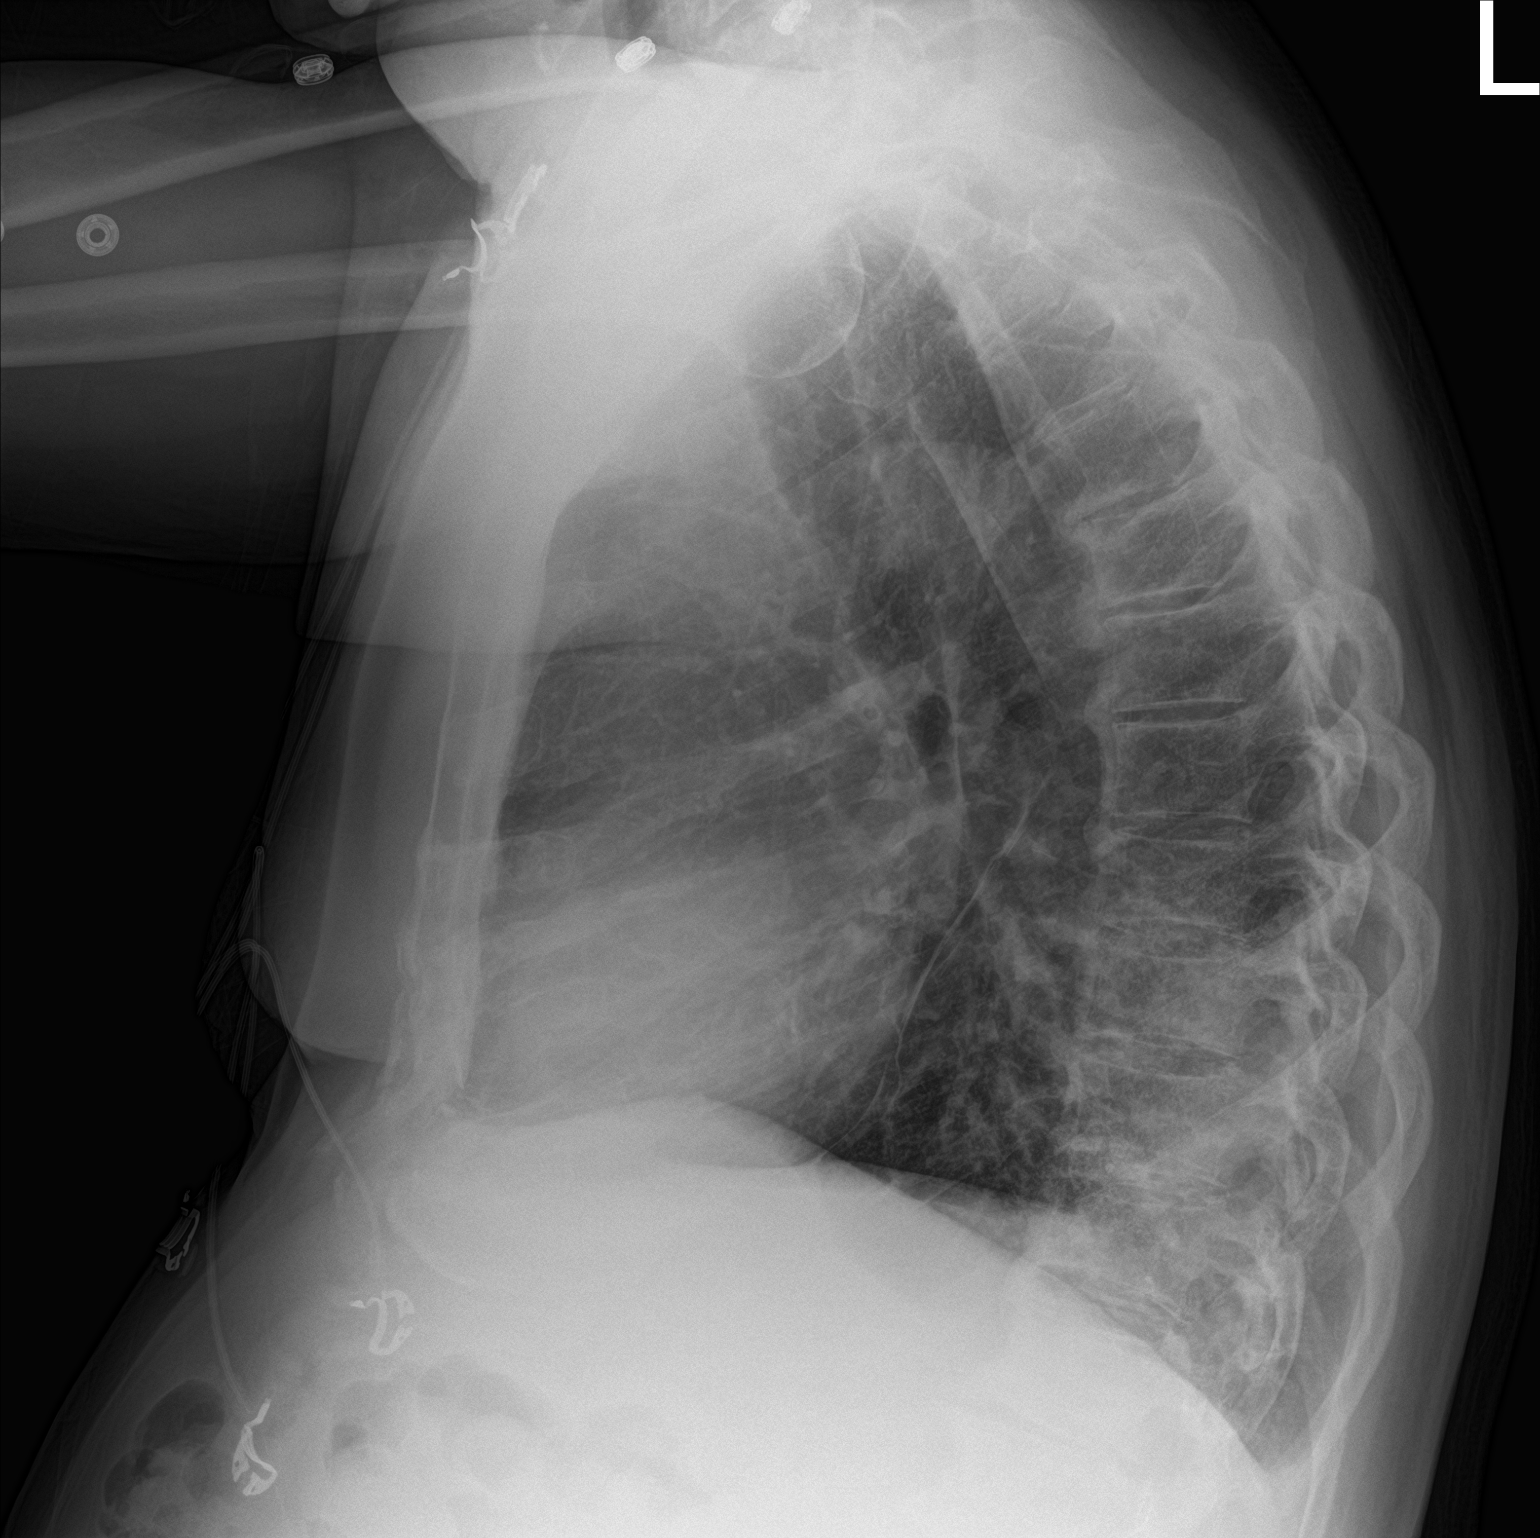

[2 of 2 positions shown; findings below may reference images not displayed]

FINDINGS: Slightly coarse lung markings in the lower chest. No definite
airspace disease in the lower lungs. Heart size is normal. No large
pleural effusions. Degenerative changes in lower thoracic spine.
IMPRESSION: No active cardiopulmonary disease.

## 2021-09-04 ENCOUNTER — Other Ambulatory Visit: Payer: Self-pay | Admitting: Cardiology

## 2021-09-04 NOTE — Telephone Encounter (Signed)
Prescription refill request for Eliquis received. ?Indication:A flutter ?Last office visit:1/23 ?Scr:1.6 ?Age: 81 ?Weight:96.6 kg ? ?Prescription refilled ? ?

## 2021-10-13 NOTE — Progress Notes (Signed)
? ?Cardiology Clinic Note  ? ?Patient Name: Robert Summers ?Date of Encounter: 10/14/2021 ? ?Primary Care Provider:  Manfred Shirts, PA ?Primary Cardiologist:  Peter Martinique, MD ? ?Patient Profile  ?  ?Robert Summers 81 year old male presents to the clinic today for evaluation of his lower extremity swelling. ? ?Past Medical History  ?  ?Past Medical History:  ?Diagnosis Date  ? AKI (acute kidney injury) ([place]) 08/10/2018  ? Arthritis   ? KNEES  ? Atrial flutter ([place]) 08/10/2018  ? Atrial flutter with rapid ventricular response ([place]) 08/09/2018  ? Chest pain 08/09/2018  ? GERD (gastroesophageal reflux disease)   ? OCCASIONAL ONLY  ? H/O hiatal hernia   ? History of gout   ? History of nonmelanoma skin cancer   ? Malignant neoplasm of prostate ([place]) 07/05/2013  ? Prediabetes   ? Prostate cancer ([place])   ? ?Past Surgical History:  ?Procedure Laterality Date  ? CARDIOVERSION N/A 08/11/2018  ? Procedure: CARDIOVERSION;  Surgeon: Buford Dresser, MD;  Location: [facility] ENDOSCOPY;  Service: Cardiovascular;  Laterality: N/A;  ? CIRCUMCISION    ? ROBOT ASSISTED LAPAROSCOPIC RADICAL PROSTATECTOMY N/A 07/05/2013  ? Procedure: ROBOTIC ASSISTED LAPAROSCOPIC NERVE SPARING RADICAL PROSTATECTOMY;  Surgeon: Irine Seal, MD;  Location: [facility] ORS;  Service: Urology;  Laterality: N/A;  ? [place]    ? REPAIRED  ? WRIST SURGERY    ? RT  ? ? ?Allergies ? ?Allergies  ?Allergen Reactions  ? Lisinopril Other (See Comments)  ?  Increased Serum Creatinine  ? Nsaids Other (See Comments)  ?  CONTRAINDICATION: Renal Insufficiency  ? Latex Rash  ? ? ?History of Present Illness  ?  ?Robert Summers has a PMH of prostate cancer, CKD stage II-3, prediabetes, GERD, arthritis, and paroxysmal atrial fibrillation.  He underwent stress test in the late 1990s which was normal.  He stopped smoking at age 89.  He was diagnosed with atrial fibrillation in 2020 when he developed chest pain for a few hours.  He presented to his PCP who noted that he was in atrial  flutter with RVR.  At that time he was referred to the emergency room.  On arrival to the emergency room it was noted that he was mildly hyperkalemic with a potassium of 5.3.  His creatinine was mildly elevated at 1.65.  His D-dimer was normal.  His echocardiogram 3/20 showed an EF of 55-60% no wall motion abnormalities.  Underwent successful DCCV on 08/11/2018.  He was placed on carvedilol 6/20 for elevated blood pressure.  It was later increased to 3.25 mg twice daily.  His Eliquis was increased to 2.5 mg twice daily 1/22.  He developed a lump in his left neck by the end of 9/21.  Ultrasound showed abnormal left supraclavicular bulky adenopathy measuring 2.6 cm.  He was seen by ENT and had an excision of his left cervical lymph node 10/21.  He was noted to have B-cell lymphoma.  He was subsequently referred to the oncology service.  He was started on  Obinutuzumab and Ibrutinib on 05/29/2020.  Since starting on therapy he has not noticed increased lower extremity swelling.  His Obinutuzumab was stopped 10/16/2020. ? ?He was seen in follow-up by Almyra Deforest PA-C 06/17/2021.  His breathing had improved.  He denied chest discomfort.  His blood pressure was well controlled.  His furosemide had been decreased to 40 mg daily. ? ?He presents to the clinic today for follow-up evaluation states on April 26 he was prescribed 120  mg of Lasix in the morning and 80 mg of Lasix in the evening.  His lower extremity edema has significantly improved.  He has recent labs that have been drawn at [facility].  We reviewed the importance of compliance with lower extremity support stockings, fluid restriction, and salt restriction.  He expressed understanding.  He is in the process of going back on his lymphoma trial.  Due to his increased lower extremity swelling his nephrologist advised that he return to cardiology for follow-up.  I will continue his 120 mg of furosemide in the morning, repeat his echocardiogram, give a salty 6 diet  sheet, have him restrict his fluids, continue lower extremity support stockings, and plan follow-up after his echocardiogram. ? ?Today he denies chest pain, shortness of breath, increased lower extremity edema, fatigue, palpitations, melena, hematuria, hemoptysis, diaphoresis, weakness, presyncope, syncope, orthopnea, and PND. ? ? ?Home Medications  ?  ?Prior to Admission medications   ?Medication Sig Start Date End Date Taking? Authorizing Provider  ?acetaminophen (TYLENOL) 500 MG tablet Take 500 mg by mouth every 6 (six) hours as needed for mild pain or moderate pain.    [provider]  ?allopurinol (ZYLOPRIM) 100 MG tablet Take 100 mg by mouth 2 (two) times daily. 11/11/18   [provider]  ?carvedilol (COREG) 3.125 MG tablet Take 1 tablet (3.125 mg total) by mouth 2 (two) times daily. 10/24/20   Martinique, Peter M, MD  ?Cholecalciferol (VITAMIN D3) 50 MCG (2000 UT) capsule Take 2,000 Units by mouth daily.    [provider]  ?colchicine 0.6 MG tablet Take 0.6 mg by mouth 2 (two) times daily. AS NEEDED ONLY    [provider]  ?ELIQUIS 2.5 MG TABS tablet TAKE 1 TABLET BY MOUTH TWICE A DAY 09/04/21   Martinique, Peter M, MD  ?furosemide (LASIX) 20 MG tablet Take 40 mg by mouth daily. Take 2 tablets(40 mg) by mouth daily    [provider]  ?ibrutinib (IMBRUVICA) 140 MG capsul Take 420 mg by mouth daily. ?Patient not taking: Reported on 06/17/2021    [provider]  ?metFORMIN (GLUCOPHAGE-XR) 500 MG 24 hr tablet Take 500 mg by mouth every morning. 11/05/19   [provider]  ?vitamin B-12 (CYANOCOBALAMIN) 1000 MCG tablet Take 1,000 mcg by mouth daily.    [provider]  ? ? ?Family History  ?  ?Family History  ?Problem Relation Age of Onset  ? CAD Father   ?     Died of MI at age 2  ? Arthritis Father   ? CAD Brother   ?     died of presumed Mi at age 28  ? Diabetes Brother   ? Diabetes Mother   ? Stroke Mother   ? AAA (abdominal aortic aneurysm) Sister    ? Hypertension Sister   ? Leukemia Brother   ? Diabetes Brother   ? Hypertension Brother   ? AAA (abdominal aortic aneurysm) Brother   ? ?He indicated that his mother is deceased. He indicated that his father is deceased. He indicated that his sister is alive. He indicated that only one of his two brothers is alive. He indicated that his maternal grandmother is deceased. He indicated that his maternal grandfather is deceased. He indicated that his paternal grandmother is deceased. He indicated that his paternal grandfather is deceased. ? ?Social History  ?  ?Social History  ? ?Socioeconomic History  ? Marital status: Married  ?  Spouse name: Not on file  ?  Number of children: Not on file  ? Years of education: Not on file  ? Highest education level: Not on file  ?Occupational History  ? Not on file  ?Tobacco Use  ? Smoking status: Former  ? Smokeless tobacco: Former  ?  Quit date: 06/28/1962  ?Vaping Use  ? Vaping Use: Never used  ?Substance and Sexual Activity  ? Alcohol use: Yes  ?  Comment: RARE  ? Drug use: No  ? Sexual activity: Not on file  ?Other Topics Concern  ? Not on file  ?Social History Narrative  ? Not on file  ? ?Social Determinants of Health  ? ?Financial Resource Strain: Not on file  ?Food Insecurity: Not on file  ?Transportation Needs: Not on file  ?Physical Activity: Not on file  ?Stress: Not on file  ?Social Connections: Not on file  ?Intimate Partner Violence: Not on file  ?  ? ?Review of Systems  ?  ?General:  No chills, fever, night sweats or weight changes.  ?Cardiovascular:  No chest pain, dyspnea on exertion, edema, orthopnea, palpitations, paroxysmal nocturnal dyspnea. ?Dermatological: No rash, lesions/masses ?Respiratory: No cough, dyspnea ?Urologic: No hematuria, dysuria ?Abdominal:   No nausea, vomiting, diarrhea, bright red blood per rectum, melena, or hematemesis ?Neurologic:  No visual changes, wkns, changes in mental status. ?All other systems reviewed and are otherwise negative  except as noted above. ? ?Physical Exam  ?  ?VS:  BP (!) 144/88   Pulse (!) 58   Ht '5\' 10"'$  (1.778 m)   Wt 223 lb 3.2 oz (101.2 kg)   SpO2 98%   BMI 32.03 kg/m?  , BMI Body mass index is 32.03 kg/m?. ?GEN:

## 2021-10-14 ENCOUNTER — Other Ambulatory Visit: Payer: Self-pay | Admitting: Cardiovascular Disease

## 2021-10-14 ENCOUNTER — Encounter: Payer: Self-pay | Admitting: General Practice

## 2021-10-14 ENCOUNTER — Ambulatory Visit: Payer: Medicare Other | Admitting: General Practice

## 2021-10-14 VITALS — BP 144/88 | HR 58 | Ht 70.0 in | Wt 223.2 lb

## 2021-10-14 DIAGNOSIS — R600 Localized edema: Secondary | ICD-10-CM | POA: Diagnosis not present

## 2021-10-14 DIAGNOSIS — I480 Paroxysmal atrial fibrillation: Secondary | ICD-10-CM | POA: Diagnosis not present

## 2021-10-14 DIAGNOSIS — I351 Nonrheumatic aortic (valve) insufficiency: Secondary | ICD-10-CM

## 2021-10-14 DIAGNOSIS — R0609 Other forms of dyspnea: Secondary | ICD-10-CM

## 2021-10-14 DIAGNOSIS — N183 Chronic kidney disease, stage 3 unspecified: Secondary | ICD-10-CM | POA: Diagnosis not present

## 2021-10-14 MED ORDER — CARVEDILOL 3.125 MG PO TABS: 3.1250 mg | ORAL_TABLET | Freq: Two times a day (BID) | ORAL | 3 refills | Status: DC

## 2021-10-14 MED ORDER — APIXABAN 2.5 MG PO TABS: 2.5000 mg | ORAL_TABLET | Freq: Two times a day (BID) | ORAL | 1 refills | Status: DC

## 2021-10-14 MED ORDER — FUROSEMIDE 40 MG PO TABS: 40.0000 mg | ORAL_TABLET | Freq: Every day | ORAL | 3 refills | Status: DC

## 2021-10-14 MED ORDER — CARVEDILOL 3.125 MG PO TABS: 3.1250 mg | ORAL_TABLET | Freq: Two times a day (BID) | ORAL | 1 refills | Status: DC

## 2021-10-14 MED ORDER — FUROSEMIDE 40 MG PO TABS
120.0000 mg | ORAL_TABLET | Freq: Every day | ORAL | 3 refills | Status: DC
Start: 2021-10-14 — End: 2022-03-16

## 2021-10-14 NOTE — Addendum Note (Signed)
Addended by: Waylan Rocher on: 10/14/2021 02:51 PM ? ? Modules accepted: Orders ? ?

## 2021-10-14 NOTE — Telephone Encounter (Signed)
PT IS TAKING '120MG'$  DAILY-CALLED PHARMACY TO NOTIFY AND SENT CORRECTED RX-VERBALIZED UNDERSTANDING ?

## 2021-10-14 NOTE — Patient Instructions (Signed)
Medication Instructions:  ?TAKE FUROSEMIDE '120MG'$  DALY (TAKE 3 TABS OF '40MG'$ -NEW RX, MAY TAKE 6 TAB OF CURRENT '20MG'$ ) ? ?*If you need a refill on your cardiac medications before your next appointment, please call your pharmacy* ? ?Lab Work:    ?NONE   ?   ?If you have labs (blood work) drawn today and your tests are completely normal, you will receive your results only by: ?MyChart Message (if you have MyChart) OR  A paper copy in the mail ?If you have any lab test that is abnormal or we need to change your treatment, we will call you to review the results. ? ?Testing/Procedures:  ?Echocardiogram - Your physician has requested that you have an echocardiogram. Echocardiography is a painless test that uses sound waves to create images of your heart. It provides your doctor with information about the size and shape of your heart and how well your heart?s chambers and valves are working. This procedure takes approximately one hour. There are no restrictions for this procedure.  ? ?Special Instructions ?FLUID RESTRICTION NO MORE THAN 50 OUNCES DAILY ? ?PLEASE READ AND FOLLOW SALTY 6-ATTACHED-1,'800mg'$  daily ? ?PLEASE INCREASE PHYSICAL ACTIVITY AS TOLERATED  ? ?Follow-Up: ?Your next appointment:  3 month(s) In Person with Peter Martinique, MD  or Coletta Memos, FNP or Almyra Deforest, PA-C      ? ?At [facility], you and your health needs are our priority.  As part of our continuing mission to provide you with exceptional heart care, we have created designated Provider Care Teams.  These Care Teams include your primary Cardiologist (physician) and Advanced Practice Providers (APPs -  Physician Assistants and Nurse Practitioners) who all work together to provide you with the care you need, when you need it. ? ? ?Important Information About Sugar ? ? ? ? ? ? ?        6 SALTY THINGS TO AVOID     1,'800MG'$  DAILY ? ? ? ? ? ? ?

## 2021-11-03 ENCOUNTER — Ambulatory Visit (HOSPITAL_COMMUNITY): Payer: Medicare Other | Attending: Cardiovascular Disease

## 2021-11-03 DIAGNOSIS — I351 Nonrheumatic aortic (valve) insufficiency: Secondary | ICD-10-CM | POA: Insufficient documentation

## 2021-11-03 DIAGNOSIS — R0609 Other forms of dyspnea: Secondary | ICD-10-CM | POA: Insufficient documentation

## 2021-11-03 LAB — ECHOCARDIOGRAM COMPLETE
Area-P 1/2: 3.17 cm2
P 1/2 time: 526 msec
S' Lateral: 3 cm

## 2021-12-17 DIAGNOSIS — E113293 Type 2 diabetes mellitus with mild nonproliferative diabetic retinopathy without macular edema, bilateral: Secondary | ICD-10-CM | POA: Insufficient documentation

## 2022-01-06 NOTE — Progress Notes (Unsigned)
Cardiology Clinic Note   Patient Name: Robert Summers Date of Encounter: 01/07/2022  Primary Care Provider:  Manfred Shirts, PA Primary Cardiologist:  Peter Martinique, MD  Patient Profile    Robert Summers 81 year old male presents to the clinic today for evaluation of his lower extremity swelling.  Past Medical History    Past Medical History:  Diagnosis Date   AKI (acute kidney injury) ([place]) 08/10/2018   Arthritis    KNEES   Atrial flutter ([place]) 08/10/2018   Atrial flutter with rapid ventricular response ([place]) 08/09/2018   Chest pain 08/09/2018   GERD (gastroesophageal reflux disease)    OCCASIONAL ONLY   H/O hiatal hernia    History of gout    History of nonmelanoma skin cancer    Malignant neoplasm of prostate ([place]) 07/05/2013   Prediabetes    Prostate cancer [facility])    Past Surgical History:  Procedure Laterality Date   CARDIOVERSION N/A 08/11/2018   Procedure: CARDIOVERSION;  Surgeon: Buford Dresser, MD;  Location: [place];  Service: Cardiovascular;  Laterality: N/A;   CIRCUMCISION     ROBOT ASSISTED LAPAROSCOPIC RADICAL PROSTATECTOMY N/A 07/05/2013   Procedure: ROBOTIC ASSISTED LAPAROSCOPIC NERVE [place];  Surgeon: Irine Seal, MD;  Location: [facility] ORS;  Service: Urology;  Laterality: N/A;   SPERMATACELE     REPAIRED   WRIST SURGERY     RT    Allergies  Allergies  Allergen Reactions   Lisinopril Other (See Comments)    Increased Serum Creatinine   Nsaids Other (See Comments)    CONTRAINDICATION: Renal Insufficiency   Latex Rash    History of Present Illness    Robert Summers has a PMH of prostate cancer, CKD stage II-3, prediabetes, GERD, arthritis, and paroxysmal atrial fibrillation.  He underwent stress test in the late 1990s which was normal.  He stopped smoking at age 58.  He was diagnosed with atrial fibrillation in 2020 when he developed chest pain for a few hours.  He presented to his PCP who noted that he was in atrial  flutter with RVR.  At that time he was referred to the emergency room.  On arrival to the emergency room it was noted that he was mildly hyperkalemic with a potassium of 5.3.  His creatinine was mildly elevated at 1.65.  His D-dimer was normal.  His echocardiogram 3/20 showed an EF of 55-60% no wall motion abnormalities.  Underwent successful DCCV on 08/11/2018.  He was placed on carvedilol 6/20 for elevated blood pressure.  It was later increased to 3.25 mg twice daily.  His Eliquis was increased to 2.5 mg twice daily 1/22.  He developed a lump in his left neck by the end of 9/21.  Ultrasound showed abnormal left supraclavicular bulky adenopathy measuring 2.6 cm.  He was seen by ENT and had an excision of his left cervical lymph node 10/21.  He was noted to have B-cell lymphoma.  He was subsequently referred to the oncology service.  He was started on  Obinutuzumab and Ibrutinib on 05/29/2020.  Since starting on therapy he has not noticed increased lower extremity swelling.  His Obinutuzumab was stopped 10/16/2020.  He was seen in follow-up by Almyra Deforest PA-C 06/17/2021.  His breathing had improved.  He denied chest discomfort.  His blood pressure was well controlled.  His furosemide had been decreased to 40 mg daily.  He presented to the clinic 10/14/2021 for follow-up evaluation stated on April 26 he was prescribed 120  mg of Lasix in the morning and 80 mg of Lasix in the evening.  His lower extremity edema had significantly improved.  He had recent labs that were  drawn at [facility].  We reviewed the importance of compliance with lower extremity support stockings, fluid restriction, and salt restriction.  He expressed understanding.  He was in the process of going back on his lymphoma trial.  Due to his increased lower extremity swelling his nephrologist advised that he return to cardiology for follow-up.  I  continued his 120 mg of furosemide in the morning, repeated his echocardiogram, gave the salty 6 diet  sheet, had him restrict his fluids, continued lower extremity support stockings, and planned follow-up after his echocardiogram.  He presents to the clinic today for follow-up evaluation states he has had chronic diarrhea for the past 2-3 months.  He has been following with GI who are planning to do colonoscopy.  I instructed him to have GI send preoperative cardiac evaluation so that we may make recommendations.  His blood pressure continues to be well-controlled.  His weight is stable.  His lower extremity swelling is stable as well.  He is compliant with his lower extremity compression stockings.  We reviewed his echocardiogram and lab work.  We will plan follow-up in 6 months.  Today he denies chest pain, shortness of breath, increased lower extremity edema, fatigue, palpitations, melena, hematuria, hemoptysis, diaphoresis, weakness, presyncope, syncope, orthopnea, and PND.   Home Medications    Prior to Admission medications   Medication Sig Start Date End Date Taking? Authorizing Provider  acetaminophen (TYLENOL) 500 MG tablet Take 500 mg by mouth every 6 (six) hours as needed for mild pain or moderate pain.    [provider]  allopurinol (ZYLOPRIM) 100 MG tablet Take 100 mg by mouth 2 (two) times daily. 11/11/18   [provider]  carvedilol (COREG) 3.125 MG tablet Take 1 tablet (3.125 mg total) by mouth 2 (two) times daily. 10/24/20   Martinique, Peter M, MD  Cholecalciferol (VITAMIN D3) 50 MCG (2000 UT) capsule Take 2,000 Units by mouth daily.    [provider]  colchicine 0.6 MG tablet Take 0.6 mg by mouth 2 (two) times daily. AS NEEDED ONLY    [provider]  ELIQUIS 2.5 MG TABS tablet TAKE 1 TABLET BY MOUTH TWICE A DAY 09/04/21   Martinique, Peter M, MD  furosemide (LASIX) 20 MG tablet Take 40 mg by mouth daily. Take 2 tablets(40 mg) by mouth daily    [provider]  ibrutinib (IMBRUVICA) 140 MG capsul Take 420 mg by mouth daily. Patient not taking:  Reported on 06/17/2021    [provider]  metFORMIN (GLUCOPHAGE-XR) 500 MG 24 hr tablet Take 500 mg by mouth every morning. 11/05/19   [provider]  vitamin B-12 (CYANOCOBALAMIN) 1000 MCG tablet Take 1,000 mcg by mouth daily.    [provider]    Family History    Family History  Problem Relation Age of Onset   CAD Father        Died of MI at age 44   Arthritis Father    CAD Brother        died of presumed Mi at age 79   Diabetes Brother    Diabetes Mother    Stroke Mother    AAA (abdominal aortic aneurysm) Sister    Hypertension Sister    Leukemia Brother    Diabetes Brother    Hypertension Brother  AAA (abdominal aortic aneurysm) Brother    He indicated that his mother is deceased. He indicated that his father is deceased. He indicated that his sister is alive. He indicated that only one of his two brothers is alive. He indicated that his maternal grandmother is deceased. He indicated that his maternal grandfather is deceased. He indicated that his paternal grandmother is deceased. He indicated that his paternal grandfather is deceased.  Social History    Social History   Socioeconomic History   Marital status: Married    Spouse name: Not on file   Number of children: Not on file   Years of education: Not on file   Highest education level: Not on file  Occupational History   Not on file  Tobacco Use   Smoking status: Former   Smokeless tobacco: Former    Quit date: 06/28/1962  Vaping Use   Vaping Use: Never used  Substance and Sexual Activity   Alcohol use: Yes    Comment: RARE   Drug use: No   Sexual activity: Not on file  Other Topics Concern   Not on file  Social History Narrative   Not on file   Social Determinants of Health   Financial Resource Strain: Not on file  Food Insecurity: Not on file  Transportation Needs: Not on file  Physical Activity: Not on file  Stress: Not on file  Social Connections: Not on file   Intimate Partner Violence: Not on file     Review of Systems    General:  No chills, fever, night sweats or weight changes.  Cardiovascular:  No chest pain, dyspnea on exertion, edema, orthopnea, palpitations, paroxysmal nocturnal dyspnea. Dermatological: No rash, lesions/masses Respiratory: No cough, dyspnea Urologic: No hematuria, dysuria Abdominal:   No nausea, vomiting, diarrhea, bright red blood per rectum, melena, or hematemesis Neurologic:  No visual changes, wkns, changes in mental status. All other systems reviewed and are otherwise negative except as noted above.  Physical Exam    VS:  BP (!) 122/58   Pulse (!) 51   Ht '5\' 9"'$  (1.753 m)   Wt 224 lb 9.6 oz (101.9 kg)   SpO2 98%   BMI 33.17 kg/m  , BMI Body mass index is 33.17 kg/m. GEN: Well nourished, well developed, in no acute distress. HEENT: normal. Neck: Supple, no JVD, carotid bruits, or masses. Cardiac: RRR, no murmurs, rubs, or gallops. No clubbing, cyanosis, left greater than right +1 pitting bilateral lower extremity edema.  Radials/DP/PT 2+ and equal bilaterally.  Respiratory:  Respirations regular and unlabored, clear to auscultation bilaterally. GI: Soft, nontender, nondistended, BS + x 4. MS: no deformity or atrophy. Skin: warm and dry, no rash. Neuro:  Strength and sensation are intact. Psych: Normal affect.  Accessory Clinical Findings    Recent Labs: No results found for requested labs within last 365 days.   Recent Lipid Panel No results found for: "CHOL", "TRIG", "HDL", "CHOLHDL", "VLDL", "LDLCALC", "LDLDIRECT"  ECG personally reviewed by me today-sinus bradycardia with first-degree AV block nonspecific intraventricular block 51 bpm  Echocardiogram 11/28/2020  IMPRESSIONS     1. Left ventricular ejection fraction, by estimation, is 60 to 65%. The  left ventricle has normal function. The left ventricle has no regional  wall motion abnormalities.   2. The aortic valve is normal in  structure. Aortic valve regurgitation is  mild. Mild aortic valve sclerosis is present, with no evidence of aortic  valve stenosis.   3. Aneurysm of the ascending aorta,  measuring 42 mm.   4. The inferior vena cava is normal in size with greater than 50%  respiratory variability, suggesting right atrial pressure of 3 mmHg.  Echocardiogram 11/03/2021  IMPRESSIONS     1. Left ventricular ejection fraction, by estimation, is 60 to 65%. The  left ventricle has normal function. The left ventricle has no regional  wall motion abnormalities. Left ventricular diastolic parameters are  indeterminate.   2. Right ventricular systolic function is normal. The right ventricular  size is normal. There is normal pulmonary artery systolic pressure. The  estimated right ventricular systolic pressure is 16.1 mmHg.   3. The mitral valve is normal in structure. Mild mitral valve  regurgitation. No evidence of mitral stenosis.   4. The aortic valve is tricuspid. Aortic valve regurgitation is mild.  Aortic valve sclerosis/calcification is present, without any evidence of  aortic stenosis. Aortic regurgitation PHT measures 526 msec.   5. Aortic dilatation noted. There is mild dilatation of the aortic root,  measuring 38 mm. There is mild dilatation of the ascending aorta,  measuring 40 mm.   6. The inferior vena cava is normal in size with greater than 50%  respiratory variability, suggesting right atrial pressure of 3 mmHg.  Assessment & Plan   1.  Lower extremity swelling-left greater than right +1 bilateral lower extremity pitting edema.  Weight today 224.  He denies orthopnea and PND.  His echocardiogram 11/03/2021 showed normal LVEF and intermediate diastolic parameters.  Details above. Continue furosemide  Elevate lower extremities when not active Heart healthy low-sodium diet Lower extremity support stockings-continue Fluid restriction-reviewed  Paroxysmal atrial fibrillation-heart rate today 51.   No episodes of increased heart rate or irregular heartbeats.  Reports compliance with apixaban and denies bleeding issues. Continue carvedilol, apixaban Avoid triggers caffeine, chocolate, EtOH, dehydration etc.-reviewed  Stage III CKD-creatinine 1.69 on 11/08/2020. Avoid nephrotoxic drugs Follows with PCP  Disposition: Follow-up with Dr. Martinique or Almyra Deforest in 4-6 month.   Jossie Ng. Dakotah Orrego NP-C    01/07/2022, 11:01 AM [address] Office [phone] Fax [phone]  Notice: This dictation was prepared with Dragon dictation along with smaller phrase technology. Any transcriptional errors that result from this process are unintentional and may not be corrected upon review.  I spent 14 minutes examining this patient, reviewing medications, and using patient centered shared decision making involving her cardiac care.  Prior to her visit I spent greater than 20 minutes reviewing her past medical history,  medications, and prior cardiac tests.

## 2022-01-07 ENCOUNTER — Ambulatory Visit: Payer: Medicare Other | Admitting: General Practice

## 2022-01-07 ENCOUNTER — Encounter: Payer: Self-pay | Admitting: General Practice

## 2022-01-07 ENCOUNTER — Telehealth: Payer: Self-pay

## 2022-01-07 ENCOUNTER — Other Ambulatory Visit: Payer: Self-pay

## 2022-01-07 VITALS — BP 122/58 | HR 51 | Ht 69.0 in | Wt 224.6 lb

## 2022-01-07 DIAGNOSIS — R600 Localized edema: Secondary | ICD-10-CM | POA: Diagnosis not present

## 2022-01-07 DIAGNOSIS — I480 Paroxysmal atrial fibrillation: Secondary | ICD-10-CM | POA: Diagnosis not present

## 2022-01-07 DIAGNOSIS — N183 Chronic kidney disease, stage 3 unspecified: Secondary | ICD-10-CM | POA: Diagnosis not present

## 2022-01-07 NOTE — Patient Instructions (Addendum)
Medication Instructions:  The current medical regimen is effective;  continue present plan and medications as directed. Please refer to the Current Medication list given to you today.   *If you need a refill on your cardiac medications before your next appointment, please call your pharmacy*  Lab Work:   Testing/Procedures:  NONE    NONE If you have labs (blood work) drawn today and your tests are completely normal, you will receive your results only by: [place] (if you have MyChart) OR  A paper copy in the mail If you have any lab test that is abnormal or we need to change your treatment, we will call you to review the results.  Special Instructions CONTINUE TO WEAR YOUR COMPRESSION STOCKINGS  CONTINUE DAILY WEIGHTS  Follow-Up: Your next appointment:  6 month(s) In Person with Peter Martinique, MD     Please contact our office 2 months in advance to schedule this appointment   At [facility], you and your health needs are our priority.  As part of our continuing mission to provide you with exceptional heart care, we have created designated Provider Care Teams.  These Care Teams include your primary Cardiologist (physician) and Advanced Practice Providers (APPs -  Physician Assistants and Nurse Practitioners) who all work together to provide you with the care you need, when you need it. Important Information About [place]

## 2022-01-07 NOTE — Telephone Encounter (Signed)
   Pre-operative Risk Assessment    Patient Name: Robert Summers  DOB: 10-06-1940 MRN: [id number]      Request for Surgical Clearance    Procedure:   Colonoscopy  Date of Surgery:  Clearance TBD                                 Surgeon:  Dr. Rolm Bookbinder, MD Surgeon's Group or Practice Name:  [facility] Phone number:  [phone] Fax number:  [phone]   Type of Clearance Requested:   - Pharmacy:  Hold Apixaban (Eliquis) 2   Type of Anesthesia:  Not Indicated   Additional requests/questions:    Signed, Wonda Horner   01/07/2022, 3:11 PM

## 2022-01-08 NOTE — Telephone Encounter (Signed)
   Patient Name: Robert Summers  DOB: 09-25-1940 MRN: [id number]  Primary Cardiologist: Peter Martinique, MD  Chart reviewed as part of pre-operative protocol coverage.   - Per Coletta Memos, patient may proceed to GI procedure without further testing.  - Per pharmD, per office protocol, patient can hold Eliquis for 2 days prior to procedure as requested.    Will route this bundled recommendation to requesting provider via Epic fax function. Please call with questions.   Charlie Pitter, PA-C 01/08/2022, 4:04 PM

## 2022-01-08 NOTE — Telephone Encounter (Signed)
Await pharm input.

## 2022-01-08 NOTE — Telephone Encounter (Signed)
Patient with diagnosis of afib on Eliquis for anticoagulation.    Procedure: colonoscopy Date of procedure: TBD  CHA2DS2-VASc Score = 4  This indicates a 4.8% annual risk of stroke. The patient's score is based upon: CHF History: 0 HTN History: 0 Diabetes History: 1 Stroke History: 0 Vascular Disease History: 1 Age Score: 2 Gender Score: 0  CrCl 57m/min using adjusted body weight Platelet count 159K  Per office protocol, patient can hold Eliquis for 2 days prior to procedure as requested.    **This guidance is not considered finalized until pre-operative APP has relayed final recommendations.**

## 2022-01-08 NOTE — Telephone Encounter (Signed)
Covering preop today. Patient just saw Coletta Memos, NP yesterday, who commented on pending GI evaluation. Will route to [place] to find out if medically cleared for colonoscopy based on yesterday's eval as well as pharm for input on holding anticoag. Once replies are received, preop team can bundle final recs and send to requesting provider.

## 2022-03-05 ENCOUNTER — Telehealth: Payer: Self-pay | Admitting: Cardiology

## 2022-03-05 ENCOUNTER — Encounter: Payer: Self-pay | Admitting: Cardiology

## 2022-03-05 NOTE — Telephone Encounter (Signed)
Pt advised Dr Doug Sou recommendations.... will forward his strips to him from My Chart.... she says in looking at his watch she did not realize the fluctuations were so up and down and HR was about  for a short time.... he is still currently doing much better in "normal" rhythm.

## 2022-03-05 NOTE — Telephone Encounter (Signed)
Pt advised Dr Doug Sou recommendations.... will forward his strips to him from My Chart.... she says in looking at his watch she did not realize the fluctuations were so up and down and HR was about  for a short time.... he is still currently doing much better in "normal" rhythm. I advised her that it can be that way with Afib and she will monitor.

## 2022-03-05 NOTE — Telephone Encounter (Signed)
.  Patient c/o Palpitations:  High priority if patient c/o lightheadedness, shortness of breath, or chest pain  How long have you had palpitations/irregular HR/ Afib? Are you having the symptoms now?   Flushed and fatigued  Are you currently experiencing lightheadedness, SOB or CP?   No  Do you have a history of afib (atrial fibrillation) or irregular heart rhythm?   Yes  Have you checked your BP or HR? (document readings if available):   Range around 72.  HR now is 71  Are you experiencing any other symptoms?   Fatigue   Wife called stating the patient's IPhone watch is showing he is in Afib.  Wife noted the patient's watch reads "ECG shows signs of Afib".  Wife stated it alerted him about 30 minutes ago.

## 2022-03-05 NOTE — Telephone Encounter (Signed)
Pts wife called to report that the pt had a 30 min episode of Afib according to his apple watch but does not have a recording of it... he felt flushed but no other symptoms,,, he did not sleep well last night so he was very tired which may precipitated the event... he is taking all of his meds and no missed Eliquis doses..   Hi rate never got above 75... she will continue to monitor... he is currently feeling well and taking a nap and will forward to Dr Martinique for his review.   She says she would like hm to know since this is the "first" noted in quite a while.

## 2022-03-12 NOTE — Telephone Encounter (Signed)
Spoke to patient's wife she stated husband is doing good.Heart in rhythm.Stated he did have a episode of afib that lasted appox 20 to 30 mins.He will keep follow up appointment with Dr.Jordan 2/9 at 10:00 am and call sooner if needed.

## 2022-03-12 NOTE — Telephone Encounter (Signed)
See previous 10/12 note.

## 2022-03-14 ENCOUNTER — Other Ambulatory Visit: Payer: Self-pay | Admitting: General Practice

## 2022-05-14 ENCOUNTER — Telehealth: Payer: Self-pay

## 2022-05-14 NOTE — Telephone Encounter (Signed)
Want to review with patient on medications he is taking, specifically his Lasix in relation to his leg swelling.

## 2022-05-21 NOTE — Telephone Encounter (Signed)
Pt is returning call.  

## 2022-06-08 NOTE — Telephone Encounter (Signed)
Called patient, no VM at this time. Want to verify how much Lasix he is taking as see 160 mg on medication list.

## 2022-07-01 NOTE — Progress Notes (Signed)
Cardiology Office Note   Date:  07/10/2022   ID:  Robert Summers, DOB 10/03/1940, MRN [id number]  PCP:  Manfred Shirts, PA  Cardiologist:  Reisha Wos Martinique, MD EP: None  Chief Complaint  Patient presents with   Atrial Fibrillation   Leg Swelling       History of Present Illness: Robert Summers is a 82 y.o. male with PMH of paroxysmal atrial flutter s/p successful DCCV 07/2018, pre-DM type 2, GERD, and CKD stage 3 who presents for follow-up of his paroxysmal atrial flutter and HTN.  Echo in March 2020 was grossly normal.    He was seen in follow up by [name] PA-C on 11/11/18 and was doing well except for elevated BP. Started on Coreg for BP. Initially started at 6.25 mg bid. This was later reduced to 3.125 mg bid.   He does have a history of DM type 2.   Eliquis was decreased to 2.5 mg twice a day in January 2022 based on renal function and age.  He developed new lump in his left neck healing by the end of September 2021, ultrasound showed abnormal left supraclavicular bulky adenopathy measuring up to 2.6 cm.  He was seen by ENT and had excisional left cervical lymph node biopsy on 03/20/2020, this revealed B-cell lymphoma, he was subsequently referred to oncology service.  He was treated with chemo/immunotherapy with good response. Recent CT showed reduction in adenopathy.  He does have chronic LE edema. Managed with diuretics, sodium restriction and support hose. CKD parameters have been stable.  Echo repeated last June and was unchanged. He denies any dyspnea or chest pain. Rare palpitations. Bruises easily but no major bleeding. Overall feels well and is active.   Past Medical History:  Diagnosis Date   AKI (acute kidney injury) ([place]) 08/10/2018   Arthritis    KNEES   Atrial flutter ([place]) 08/10/2018   Atrial flutter with rapid ventricular response ([place]) 08/09/2018   Chest pain 08/09/2018   GERD (gastroesophageal reflux disease)    OCCASIONAL ONLY   H/O hiatal hernia     History of gout    History of nonmelanoma skin cancer    Malignant neoplasm of prostate ([place]) 07/05/2013   Prediabetes    Prostate cancer [facility])     Past Surgical History:  Procedure Laterality Date   CARDIOVERSION N/A 08/11/2018   Procedure: CARDIOVERSION;  Surgeon: Buford Dresser, MD;  Location: [facility];  Service: Cardiovascular;  Laterality: N/A;   CIRCUMCISION     ROBOT ASSISTED LAPAROSCOPIC RADICAL PROSTATECTOMY N/A 07/05/2013   Procedure: ROBOTIC ASSISTED LAPAROSCOPIC NERVE [place];  Surgeon: Irine Seal, MD;  Location: [facility];  Service: Urology;  Laterality: N/A;   SPERMATACELE     REPAIRED   WRIST SURGERY     RT     Current Outpatient Medications  Medication Sig Dispense Refill   allopurinol (ZYLOPRIM) 100 MG tablet Take 100 mg by mouth 2 (two) times daily.     apixaban (ELIQUIS) 2.5 MG TABS tablet Take 1 tablet (2.5 mg total) by mouth 2 (two) times daily. 180 tablet 1   carvedilol (COREG) 3.125 MG tablet Take 1 tablet (3.125 mg total) by mouth 2 (two) times daily. 180 tablet 1   Cholecalciferol (VITAMIN D3) 50 MCG (2000 UT) capsule Take 2,000 Units by mouth daily.     colchicine 0.6 MG tablet Take 0.6 mg by mouth 2 (two) times daily. AS NEEDED ONLY     furosemide (LASIX) 40  MG tablet TAKE 3 TABLETS (120 MG TOTAL) BY MOUTH DAILY. 270 tablet 2   furosemide (LASIX) 40 MG tablet Take 80 mg by mouth 2 (two) times daily.     ibrutinib (IMBRUVICA) 140 MG capsul Take 420 mg by mouth daily.     metFORMIN (GLUCOPHAGE-XR) 500 MG 24 hr tablet Take 500 mg by mouth daily.     vitamin B-12 (CYANOCOBALAMIN) 1000 MCG tablet Take 1,000 mcg by mouth daily.     No current facility-administered medications for this visit.    Allergies:   Lisinopril, Nsaids, and Latex    Social History:  The patient  reports that he has quit smoking. He quit smokeless tobacco use about 60 years ago. He reports current alcohol use. He reports that he does not use drugs.    Family History:  The patient's family history includes AAA (abdominal aortic aneurysm) in his brother and sister; Arthritis in his father; CAD in his brother and father; Diabetes in his brother, brother, and mother; Hypertension in his brother and sister; Leukemia in his brother; Stroke in his mother.    ROS:  Please see the history of present illness.   Otherwise, review of systems are positive for none.   All other systems are reviewed and negative.    PHYSICAL EXAM: VS:  BP (!) 150/64 (BP Location: Right Arm, Cuff Size: Large)   Pulse (!) 54   Ht 5' 9"$  (1.753 m)   Wt 226 lb (102.5 kg)   SpO2 97%   BMI 33.37 kg/m  , BMI Body mass index is 33.37 kg/m. GEN: Well nourished, well developed, in no acute distress HEENT: sclera anicteric Neck: no JVD, carotid bruits, or masses Cardiac: RRR; no murmurs, rubs, or gallops, 1+ LE edema  Respiratory:  clear to auscultation bilaterally, normal work of breathing GI: soft, nontender, nondistended, + BS MS: no deformity or atrophy Skin: warm and dry, no rash Neuro:  Strength and sensation are intact Psych: euthymic mood, full affect   EKG:  EKG is not ordered today.   Recent Labs: No results found for requested labs within last 365 days.    Lipid Panel No results found for: "CHOL", "TRIG", "HDL", "CHOLHDL", "VLDL", "LDLCALC", "LDLDIRECT"  Dated 06/08/16: cholesterol 175, triglycerides 216, HDL 36, LDL 96. A1c 6.8% Dated 08/09/19: Normal CBC, uric acid, TSH. A1c 7.7%. glucose 147 otherwise CMET normal. Cholesterol 173, triglycerides 210, HDL 38, LDL 93.  Dated 02/24/22: A1c 7.3%. cholesterol 149, triglycerides 145, HDL 33, LDL 87.  Dated 07/08/22: CBC normal. BUN 29, creatinine 1.89. GFR 36. glucose 265. Otherwise CMET normal.   Wt Readings from Last 3 Encounters:  07/10/22 226 lb (102.5 kg)  01/07/22 224 lb 9.6 oz (101.9 kg)  10/14/21 223 lb 3.2 oz (101.2 kg)      Other studies Reviewed: Additional studies/ records that were  reviewed today include:   Echocardiogram 07/2018: IMPRESSIONS      1. The left ventricle has normal systolic function, with an ejection fraction of 55-60%. The cavity size was normal. Left ventricular diastolic function could not be evaluated due to nondiagnostic images.  2. Extremely limited views without use of contrast. Definity (echo contrast) shows grossly normal function, no clear wall motion abnormalities. Anteroseptum appears to have an area that appears thinner at rest, but it does contract.  3. The right ventricle has normal systolic function. The cavity was normal. There is no increase in right ventricular wall thickness. Right ventricular systolic pressure could not be assessed.  4.  Left atrial size was not well visualized.  5. The mitral valve is grossly normal.  6. The tricuspid valve is grossly normal.  7. The aortic valve is tricuspid Mild calcification of the aortic valve. Aortic valve regurgitation was not assessed by color flow Doppler.   SUMMARY   Extremely limited windows without use of echo contrast. With contrast, grossly normal LV function, no apparent WMA or significant valve disease. In atrial flutter throughout study.  Echo 11/01/20: IMPRESSIONS     1. Left ventricular ejection fraction, by estimation, is 60 to 65%. The  left ventricle has normal function. The left ventricle has no regional  wall motion abnormalities.   2. The aortic valve is normal in structure. Aortic valve regurgitation is  mild. Mild aortic valve sclerosis is present, with no evidence of aortic  valve stenosis.   3. Aneurysm of the ascending aorta, measuring 42 mm.   4. The inferior vena cava is normal in size with greater than 50%  respiratory variability, suggesting right atrial pressure of 3 mmHg.    Echo 11/03/21: IMPRESSIONS     1. Left ventricular ejection fraction, by estimation, is 60 to 65%. The  left ventricle has normal function. The left ventricle has no regional  wall  motion abnormalities. Left ventricular diastolic parameters are  indeterminate.   2. Right ventricular systolic function is normal. The right ventricular  size is normal. There is normal pulmonary artery systolic pressure. The  estimated right ventricular systolic pressure is 0000000 mmHg.   3. The mitral valve is normal in structure. Mild mitral valve  regurgitation. No evidence of mitral stenosis.   4. The aortic valve is tricuspid. Aortic valve regurgitation is mild.  Aortic valve sclerosis/calcification is present, without any evidence of  aortic stenosis. Aortic regurgitation PHT measures 526 msec.   5. Aortic dilatation noted. There is mild dilatation of the aortic root,  measuring 38 mm. There is mild dilatation of the ascending aorta,  measuring 40 mm.   6. The inferior vena cava is normal in size with greater than 50%  respiratory variability, suggesting right atrial pressure of 3 mmHg.   ASSESSMENT AND PLAN:  Bilateral lower extremity edema: Patient was previously started on Obinutuzumab and Ibrutinib on 05/29/2020, since that time he has been developing worsening bilateral lower extremity edema.  Obinutuzumab has been completed and he was supposed to continue on Ibrutinib long term. No evidence of cardiac cause for swelling based on Echo. Will continue current dose of diuretics. Continue compression hose, elevation of feet when possible and restriction of sodium intake.     CKD stage IIIb: per Nephrology   PAF: Continue Eliquis and carvedilol.  Currently maintaining sinus rhythm. On lower dose of Eliquis based on age and renal function   Small lymphocytic lymphoma: Followed by Dr. Cassell Clement of [facility] hematology/oncology. Recent CT showed improvement.     Current medicines are reviewed at length with the patient today.  The patient does not have concerns regarding medicines.  The following changes have been made:  none  Labs/ tests ordered today include:   No orders of the  defined types were placed in this encounter.     Disposition:   FU 6 months  Signed, Delfina Schreurs Martinique, MD  07/10/2022 10:03 AM

## 2022-07-10 ENCOUNTER — Encounter: Payer: Self-pay | Admitting: Cardiology

## 2022-07-10 ENCOUNTER — Ambulatory Visit: Payer: Medicare Other | Attending: Cardiology | Admitting: Cardiology

## 2022-07-10 VITALS — BP 150/64 | HR 54 | Ht 69.0 in | Wt 226.0 lb

## 2022-07-10 DIAGNOSIS — I480 Paroxysmal atrial fibrillation: Secondary | ICD-10-CM

## 2022-07-10 DIAGNOSIS — I10 Essential (primary) hypertension: Secondary | ICD-10-CM | POA: Diagnosis not present

## 2022-07-10 DIAGNOSIS — C8300 Small cell B-cell lymphoma, unspecified site: Secondary | ICD-10-CM | POA: Diagnosis not present

## 2022-07-10 DIAGNOSIS — R600 Localized edema: Secondary | ICD-10-CM

## 2022-07-10 NOTE — Patient Instructions (Signed)
Medication Instructions:  Continue current medication  *If you need a refill on your cardiac medications before your next appointment, please call your pharmacy*   Lab Work: None Ordered   Testing/Procedures: None Ordered   Follow-Up: At [facility], you and your health needs are our priority.  As part of our continuing mission to provide you with exceptional heart care, we have created designated Provider Care Teams.  These Care Teams include your primary Cardiologist (physician) and Advanced Practice Providers (APPs -  Physician Assistants and Nurse Practitioners) who all work together to provide you with the care you need, when you need it.  We recommend signing up for the patient portal called "MyChart".  Sign up information is provided on this After Visit Summary.  MyChart is used to connect with patients for Virtual Visits (Telemedicine).  Patients are able to view lab/test results, encounter notes, upcoming appointments, etc.  Non-urgent messages can be sent to your provider as well.   To learn more about what you can do with MyChart, go to [email or web address].    Your next appointment:   6 month(s)  Provider:   Peter Martinique, MD     Other Instructions

## 2022-11-12 ENCOUNTER — Other Ambulatory Visit: Payer: Self-pay | Admitting: General Practice

## 2022-11-12 DIAGNOSIS — I480 Paroxysmal atrial fibrillation: Secondary | ICD-10-CM

## 2022-11-12 NOTE — Telephone Encounter (Signed)
Prescription refill request for Eliquis received. Indication: Afib  Last office visit: 07/10/22 ([place])  Scr: 2.09 (08/25/22)  Age: 82 Weight: 102.5kg  Appropriate dose. Refill sent.

## 2023-01-21 NOTE — Progress Notes (Signed)
Cardiology Office Note   Date:  01/25/2023   ID:  Robert Summers, DOB December 21, 1940, MRN [id number]  PCP:  Shellia Cleverly, PA  Cardiologist:  Rimas Gilham [place], MD EP: None  Chief Complaint  Patient presents with   Atrial Fibrillation       History of Present Illness: Robert Summers is a 82 y.o. male with PMH of paroxysmal atrial flutter s/p successful DCCV 07/2018, pre-DM type 2, GERD, and CKD stage 3 who presents for follow-up of his paroxysmal atrial flutter and HTN.  Echo in March 2020 was grossly normal.   He does have a history of DM type 2.   Eliquis was decreased to 2.5 mg twice a day in January 2022 based on renal function and age.  He developed new lump in his left neck healing by the end of September 2021, ultrasound showed abnormal left supraclavicular bulky adenopathy measuring up to 2.6 cm.  He was seen by ENT and had excisional left cervical lymph node biopsy on 03/20/2020, this revealed B-cell lymphoma, he was subsequently referred to oncology service.  He was treated with chemo/immunotherapy with good response.  He does have chronic LE edema. Managed with diuretics, sodium restriction and support hose. CKD parameters have been stable.  Echo repeated June 2023 and was unchanged.   Last CT surveillance in Feb 2024 showed marked improvement in adenopathy.   He denies any dyspnea or chest pain. He may have some swelling at the end of the day. Feels some lack of stamina.    Past Medical History:  Diagnosis Date   AKI (acute kidney injury) (HCC) 08/10/2018   Arthritis    KNEES   Atrial flutter (HCC) 08/10/2018   Atrial flutter with rapid ventricular response (HCC) 08/09/2018   Chest pain 08/09/2018   GERD (gastroesophageal reflux disease)    OCCASIONAL ONLY   H/O hiatal hernia    History of gout    History of nonmelanoma skin cancer    Malignant neoplasm of prostate (HCC) 07/05/2013   Prediabetes    Prostate cancer [facility])     Past Surgical History:  Procedure  Laterality Date   CARDIOVERSION N/A 08/11/2018   Procedure: CARDIOVERSION;  Surgeon: Jodelle Red, MD;  Location: [facility] ENDOSCOPY;  Service: Cardiovascular;  Laterality: N/A;   CIRCUMCISION     ROBOT ASSISTED LAPAROSCOPIC RADICAL PROSTATECTOMY N/A 07/05/2013   Procedure: ROBOTIC ASSISTED LAPAROSCOPIC NERVE SPARING RADICAL PROSTATECTOMY;  Surgeon: Bjorn Pippin, MD;  Location: [facility] ORS;  Service: Urology;  Laterality: N/A;   SPERMATACELE     REPAIRED   WRIST SURGERY     RT     Current Outpatient Medications  Medication Sig Dispense Refill   allopurinol (ZYLOPRIM) 100 MG tablet Take 100 mg by mouth 2 (two) times daily.     Cholecalciferol (VITAMIN D3) 50 MCG (2000 UT) capsule Take 2,000 Units by mouth daily.     colchicine 0.6 MG tablet Take 0.6 mg by mouth 2 (two) times daily. AS NEEDED ONLY     ELIQUIS 2.5 MG TABS tablet TAKE 1 TABLET BY MOUTH TWICE A DAY 180 tablet 1   furosemide (LASIX) 40 MG tablet Take 80 mg by mouth 2 (two) times daily.     ibrutinib (IMBRUVICA) 140 MG capsul Take 420 mg by mouth daily.     metFORMIN (GLUCOPHAGE-XR) 500 MG 24 hr tablet Take 500 mg by mouth daily.     PIOGLITAZONE HCL PO Take by mouth daily.     vitamin B-12 (CYANOCOBALAMIN)  1000 MCG tablet Take 1,000 mcg by mouth daily.     No current facility-administered medications for this visit.    Allergies:   Lisinopril, Nsaids, and Latex    Social History:  The patient  reports that he has quit smoking. He quit smokeless tobacco use about 60 years ago. He reports current alcohol use. He reports that he does not use drugs.   Family History:  The patient's family history includes AAA (abdominal aortic aneurysm) in his brother and sister; Arthritis in his father; CAD in his brother and father; Diabetes in his brother, brother, and mother; Hypertension in his brother and sister; Leukemia in his brother; Stroke in his mother.    ROS:  Please see the history of present illness.   Otherwise, review of systems  are positive for none.   All other systems are reviewed and negative.    PHYSICAL EXAM: VS:  BP 136/66 (BP Location: Left Arm, Patient Position: Sitting, Cuff Size: Large)   Pulse (!) 48   Wt 232 lb 3.2 oz (105.3 kg)   SpO2 97%   BMI 34.29 kg/m  , BMI Body mass index is 34.29 kg/m. GEN: Well nourished, well developed, in no acute distress HEENT: sclera anicteric Neck: no JVD, carotid bruits, or masses Cardiac: RRR; no murmurs, rubs, or gallops, tr-1+ LE edema - compression hose in place.  Respiratory:  clear to auscultation bilaterally, normal work of breathing GI: soft, nontender, nondistended, + BS MS: no deformity or atrophy Skin: warm and dry, no rash Neuro:  Strength and sensation are intact Psych: euthymic mood, full affect   EKG Interpretation Date/Time:  Monday January 25 2023 11:01:15 EDT Ventricular Rate:  48 PR Interval:  268 QRS Duration:  114 QT Interval:  438 QTC Calculation: 391 R Axis:   257  Text Interpretation: Sinus bradycardia with 1st degree A-V block Right superior axis deviation When compared with ECG of 11-Aug-2018 13:33, Vent. rate has decreased BY  28 BPM QRS axis Shifted left Confirmed by [place], Kaleah Hagemeister [phone]) on 01/25/2023 11:08:58 AM     Recent Labs: No results found for requested labs within last 365 days.    Lipid Panel No results found for: "CHOL", "TRIG", "HDL", "CHOLHDL", "VLDL", "LDLCALC", "LDLDIRECT"  Dated 06/08/16: cholesterol 175, triglycerides 216, HDL 36, LDL 96. A1c 6.8% Dated 08/09/19: Normal CBC, uric acid, TSH. A1c 7.7%. glucose 147 otherwise CMET normal. Cholesterol 173, triglycerides 210, HDL 38, LDL 93.  Dated 02/24/22: A1c 7.3%. cholesterol 149, triglycerides 145, HDL 33, LDL 87.  Dated 07/08/22: CBC normal. BUN 29, creatinine 1.89. GFR 36. glucose 265. Otherwise CMET normal.  Dated 12/16/22: BUN 46, creatinine 2.12. otherwise CMET and CBC normal.   Wt Readings from Last 3 Encounters:  01/25/23 232 lb 3.2 oz (105.3 kg)   07/10/22 226 lb (102.5 kg)  01/07/22 224 lb 9.6 oz (101.9 kg)      Other studies Reviewed: Additional studies/ records that were reviewed today include:   Echocardiogram 07/2018: IMPRESSIONS      1. The left ventricle has normal systolic function, with an ejection fraction of 55-60%. The cavity size was normal. Left ventricular diastolic function could not be evaluated due to nondiagnostic images.  2. Extremely limited views without use of contrast. Definity (echo contrast) shows grossly normal function, no clear wall motion abnormalities. Anteroseptum appears to have an area that appears thinner at rest, but it does contract.  3. The right ventricle has normal systolic function. The cavity was normal. There is no  increase in right ventricular wall thickness. Right ventricular systolic pressure could not be assessed.  4. Left atrial size was not well visualized.  5. The mitral valve is grossly normal.  6. The tricuspid valve is grossly normal.  7. The aortic valve is tricuspid Mild calcification of the aortic valve. Aortic valve regurgitation was not assessed by color flow Doppler.   SUMMARY   Extremely limited windows without use of echo contrast. With contrast, grossly normal LV function, no apparent WMA or significant valve disease. In atrial flutter throughout study.  Echo 11/01/20: IMPRESSIONS     1. Left ventricular ejection fraction, by estimation, is 60 to 65%. The  left ventricle has normal function. The left ventricle has no regional  wall motion abnormalities.   2. The aortic valve is normal in structure. Aortic valve regurgitation is  mild. Mild aortic valve sclerosis is present, with no evidence of aortic  valve stenosis.   3. Aneurysm of the ascending aorta, measuring 42 mm.   4. The inferior vena cava is normal in size with greater than 50%  respiratory variability, suggesting right atrial pressure of 3 mmHg.    Echo 11/03/21: IMPRESSIONS     1. Left  ventricular ejection fraction, by estimation, is 60 to 65%. The  left ventricle has normal function. The left ventricle has no regional  wall motion abnormalities. Left ventricular diastolic parameters are  indeterminate.   2. Right ventricular systolic function is normal. The right ventricular  size is normal. There is normal pulmonary artery systolic pressure. The  estimated right ventricular systolic pressure is 29.6 mmHg.   3. The mitral valve is normal in structure. Mild mitral valve  regurgitation. No evidence of mitral stenosis.   4. The aortic valve is tricuspid. Aortic valve regurgitation is mild.  Aortic valve sclerosis/calcification is present, without any evidence of  aortic stenosis. Aortic regurgitation PHT measures 526 msec.   5. Aortic dilatation noted. There is mild dilatation of the aortic root,  measuring 38 mm. There is mild dilatation of the ascending aorta,  measuring 40 mm.   6. The inferior vena cava is normal in size with greater than 50%  respiratory variability, suggesting right atrial pressure of 3 mmHg.   ASSESSMENT AND PLAN:  Bilateral lower extremity edema:  No evidence of cardiac cause for swelling based on Echo. Will continue current dose of diuretics. Continue compression hose, elevation of feet when possible and restriction of sodium intake.     CKD stage IIIb: per Nephrology   PAF: Continue Eliquis. Lower dose based on age and renal function.  Currently maintaining sinus rhythm. Recommend stopping low dose Coreg now since pulse rate is slow. If BP increases would consider alternative antihypertensive therapy that would not slow HR.   Small lymphocytic lymphoma: Followed by Dr. Alphonsa Gin of [facility] hematology/oncology. Recent CT showed improvement.     Current medicines are reviewed at length with the patient today.  The patient does not have concerns regarding medicines.  The following changes have been made:  none  Labs/ tests ordered today  include:   Orders Placed This Encounter  Procedures   EKG 12-Lead      Disposition:   FU one year Signed, Ottavio Norem [place], MD  01/25/2023 11:19 AM

## 2023-01-25 ENCOUNTER — Ambulatory Visit: Payer: Medicare Other | Attending: Cardiology | Admitting: Cardiology

## 2023-01-25 ENCOUNTER — Encounter: Payer: Self-pay | Admitting: Cardiology

## 2023-01-25 VITALS — BP 136/66 | HR 48 | Wt 232.2 lb

## 2023-01-25 DIAGNOSIS — I10 Essential (primary) hypertension: Secondary | ICD-10-CM

## 2023-01-25 DIAGNOSIS — C8300 Small cell B-cell lymphoma, unspecified site: Secondary | ICD-10-CM

## 2023-01-25 DIAGNOSIS — R600 Localized edema: Secondary | ICD-10-CM

## 2023-01-25 DIAGNOSIS — I4892 Unspecified atrial flutter: Secondary | ICD-10-CM

## 2023-01-25 NOTE — Patient Instructions (Signed)
Medication Instructions:   - Your physician wants you to STOP taking carvedilol effective today 01/25/23.   *If you need a refill on your cardiac medications before your next appointment, please call your pharmacy*   Follow-Up: At [facility], you and your health needs are our priority.  As part of our continuing mission to provide you with exceptional heart care, we have created designated Provider Care Teams.  These Care Teams include your primary Cardiologist (physician) and Advanced Practice Providers (APPs -  Physician Assistants and Nurse Practitioners) who all work together to provide you with the care you need, when you need it.  We recommend signing up for the patient portal called "MyChart".  Sign up information is provided on this After Visit Summary.  MyChart is used to connect with patients for Virtual Visits (Telemedicine).  Patients are able to view lab/test results, encounter notes, upcoming appointments, etc.  Non-urgent messages can be sent to your provider as well.   To learn more about what you can do with MyChart, go to [email or web address].    Your next appointment:   1 year(s): call in may 2025 to schedule next appointment.   Provider:   Peter [place], MD

## 2023-03-09 ENCOUNTER — Telehealth: Payer: Self-pay | Admitting: Cardiology

## 2023-03-09 NOTE — Telephone Encounter (Signed)
Spoke to patient's wife she stated husband is at [facility] ED at present.Stated he has covid.Temp 102.2 pulse 111 at present.He is being admitted to receive IV fluids and IV antibiotics.I will make Dr.Jordan aware.

## 2023-03-09 NOTE — Telephone Encounter (Signed)
STAT if HR is under 50 or over 120 (normal HR is 60-100 beats per minute)  What is your heart rate?   HR 121  Do you have a log of your heart rate readings (document readings)?   No  Do you have any other symptoms?    Wife Drinda Butts) stated patient has had a fever due to COVID and this temperature was 102.4 and his HR has been ranging from 9-121.  Wife wants a call back directly from RN Elnita Maxwell to address increased HR and if any medication changes are needed.

## 2023-03-09 NOTE — Telephone Encounter (Signed)
Call to patient wife and she places me on hold to take a call.  Lost call

## 2023-03-09 NOTE — Telephone Encounter (Signed)
Pt c/o medication issue:  1. Name of Medication: Carvedilol   2. How are you currently taking this medication (dosage and times per day)?   3. Are you having a reaction (difficulty breathing--STAT)?   4. What is your medication issue?   Patient's wife states that patient was diagnosed with Covid since last Wednesday 10/02. She states that his fever has just read 102.7. She believes there may be a secondary infection. Requesting call back to discuss this. Worried about afib. Would like to know if patient should restart this medication. Please advise.

## 2023-04-02 DEATH — deceased
